# Patient Record
Sex: Male | Born: 1935 | Race: White | Hispanic: No | Marital: Married | Smoking: Never smoker
Health system: Southern US, Community
[De-identification: ages and names within clinical notes are randomized; demographics above are authoritative.]

## PROBLEM LIST (undated history)

## (undated) DIAGNOSIS — C801 Malignant (primary) neoplasm, unspecified: Secondary | ICD-10-CM

## (undated) DIAGNOSIS — I951 Orthostatic hypotension: Secondary | ICD-10-CM

## (undated) DIAGNOSIS — G20A1 Parkinson's disease without dyskinesia, without mention of fluctuations: Secondary | ICD-10-CM

## (undated) DIAGNOSIS — K469 Unspecified abdominal hernia without obstruction or gangrene: Secondary | ICD-10-CM

## (undated) DIAGNOSIS — G20 Parkinson's disease: Secondary | ICD-10-CM

## (undated) DIAGNOSIS — C4360 Malignant melanoma of unspecified upper limb, including shoulder: Secondary | ICD-10-CM

## (undated) DIAGNOSIS — I5032 Chronic diastolic (congestive) heart failure: Secondary | ICD-10-CM

## (undated) DIAGNOSIS — M1990 Unspecified osteoarthritis, unspecified site: Secondary | ICD-10-CM

## (undated) DIAGNOSIS — I480 Paroxysmal atrial fibrillation: Secondary | ICD-10-CM

## (undated) DIAGNOSIS — H409 Unspecified glaucoma: Secondary | ICD-10-CM

## (undated) DIAGNOSIS — R55 Syncope and collapse: Secondary | ICD-10-CM

## (undated) HISTORY — PX: JOINT REPLACEMENT: SHX530

## (undated) HISTORY — DX: Parkinson's disease: G20

## (undated) HISTORY — DX: Unspecified osteoarthritis, unspecified site: M19.90

## (undated) HISTORY — DX: Parkinson's disease without dyskinesia, without mention of fluctuations: G20.A1

## (undated) HISTORY — PX: EYE SURGERY: SHX253

## (undated) HISTORY — DX: Malignant (primary) neoplasm, unspecified: C80.1

## (undated) HISTORY — DX: Malignant melanoma of unspecified upper limb, including shoulder: C43.60

## (undated) HISTORY — DX: Orthostatic hypotension: I95.1

## (undated) HISTORY — DX: Unspecified glaucoma: H40.9

## (undated) HISTORY — DX: Unspecified abdominal hernia without obstruction or gangrene: K46.9

## (undated) HISTORY — DX: Syncope and collapse: R55

---

## 2003-05-28 DIAGNOSIS — H409 Unspecified glaucoma: Secondary | ICD-10-CM

## 2003-05-28 HISTORY — DX: Unspecified glaucoma: H40.9

## 2003-05-28 HISTORY — PX: REPLACEMENT TOTAL KNEE: SUR1224

## 2003-06-27 ENCOUNTER — Other Ambulatory Visit: Payer: Self-pay

## 2006-05-16 ENCOUNTER — Ambulatory Visit: Payer: Self-pay | Admitting: Family Medicine

## 2007-05-28 HISTORY — PX: LYMPH NODE DISSECTION: SHX5087

## 2007-05-28 HISTORY — PX: MELANOMA EXCISION: SHX5266

## 2007-11-25 ENCOUNTER — Ambulatory Visit: Payer: Self-pay | Admitting: Internal Medicine

## 2007-12-03 ENCOUNTER — Ambulatory Visit: Payer: Self-pay | Admitting: Internal Medicine

## 2007-12-10 ENCOUNTER — Ambulatory Visit: Payer: Self-pay | Admitting: Internal Medicine

## 2007-12-16 ENCOUNTER — Other Ambulatory Visit: Payer: Self-pay

## 2007-12-16 ENCOUNTER — Ambulatory Visit: Payer: Self-pay | Admitting: General Surgery

## 2007-12-26 ENCOUNTER — Ambulatory Visit: Payer: Self-pay | Admitting: Internal Medicine

## 2007-12-29 ENCOUNTER — Ambulatory Visit: Payer: Self-pay | Admitting: General Surgery

## 2007-12-29 DIAGNOSIS — C4360 Malignant melanoma of unspecified upper limb, including shoulder: Secondary | ICD-10-CM

## 2007-12-29 HISTORY — DX: Malignant melanoma of unspecified upper limb, including shoulder: C43.60

## 2008-01-26 ENCOUNTER — Ambulatory Visit: Payer: Self-pay | Admitting: Urology

## 2008-01-26 ENCOUNTER — Ambulatory Visit: Payer: Self-pay | Admitting: Internal Medicine

## 2008-06-27 ENCOUNTER — Ambulatory Visit: Payer: Self-pay | Admitting: Internal Medicine

## 2008-07-05 ENCOUNTER — Ambulatory Visit: Payer: Self-pay | Admitting: Internal Medicine

## 2008-07-25 ENCOUNTER — Ambulatory Visit: Payer: Self-pay | Admitting: Internal Medicine

## 2008-11-24 ENCOUNTER — Ambulatory Visit: Payer: Self-pay | Admitting: Internal Medicine

## 2008-12-06 ENCOUNTER — Ambulatory Visit: Payer: Self-pay | Admitting: Internal Medicine

## 2008-12-25 ENCOUNTER — Ambulatory Visit: Payer: Self-pay | Admitting: Internal Medicine

## 2009-01-25 ENCOUNTER — Ambulatory Visit: Payer: Self-pay | Admitting: Internal Medicine

## 2009-06-27 ENCOUNTER — Ambulatory Visit: Payer: Self-pay | Admitting: Internal Medicine

## 2009-07-14 ENCOUNTER — Ambulatory Visit: Payer: Self-pay | Admitting: Internal Medicine

## 2009-07-25 ENCOUNTER — Ambulatory Visit: Payer: Self-pay | Admitting: Internal Medicine

## 2010-01-01 ENCOUNTER — Ambulatory Visit: Payer: Self-pay | Admitting: Internal Medicine

## 2010-01-03 ENCOUNTER — Ambulatory Visit: Payer: Self-pay | Admitting: Internal Medicine

## 2010-01-05 ENCOUNTER — Ambulatory Visit: Payer: Self-pay | Admitting: Internal Medicine

## 2010-01-25 ENCOUNTER — Ambulatory Visit: Payer: Self-pay | Admitting: Internal Medicine

## 2010-07-25 ENCOUNTER — Ambulatory Visit: Payer: Self-pay | Admitting: Internal Medicine

## 2010-07-26 ENCOUNTER — Ambulatory Visit: Payer: Self-pay | Admitting: Internal Medicine

## 2010-08-26 ENCOUNTER — Ambulatory Visit: Payer: Self-pay | Admitting: Internal Medicine

## 2011-01-15 ENCOUNTER — Ambulatory Visit: Payer: Self-pay | Admitting: Internal Medicine

## 2011-02-12 ENCOUNTER — Ambulatory Visit: Payer: Self-pay | Admitting: Internal Medicine

## 2011-02-25 ENCOUNTER — Ambulatory Visit: Payer: Self-pay | Admitting: Internal Medicine

## 2011-08-14 ENCOUNTER — Ambulatory Visit: Payer: Self-pay | Admitting: Internal Medicine

## 2011-08-14 LAB — CBC CANCER CENTER
Basophil #: 0 x10 3/mm (ref 0.0–0.1)
Basophil %: 0.7 %
Eosinophil #: 0.2 x10 3/mm (ref 0.0–0.7)
Eosinophil %: 3.6 %
HCT: 41.8 % (ref 40.0–52.0)
Lymphocyte #: 1.4 x10 3/mm (ref 1.0–3.6)
MCH: 29.8 pg (ref 26.0–34.0)
MCHC: 33.3 g/dL (ref 32.0–36.0)
MCV: 89 fL (ref 80–100)
Monocyte #: 0.6 x10 3/mm (ref 0.0–0.7)
Monocyte %: 10.7 %
Neutrophil #: 3.5 x10 3/mm (ref 1.4–6.5)
Platelet: 185 x10 3/mm (ref 150–440)
RBC: 4.68 10*6/uL (ref 4.40–5.90)
RDW: 14.6 % — ABNORMAL HIGH (ref 11.5–14.5)
WBC: 5.7 x10 3/mm (ref 3.8–10.6)

## 2011-08-14 LAB — COMPREHENSIVE METABOLIC PANEL
Albumin: 3.7 g/dL (ref 3.4–5.0)
Alkaline Phosphatase: 87 U/L (ref 50–136)
Anion Gap: 5 — ABNORMAL LOW (ref 7–16)
BUN: 17 mg/dL (ref 7–18)
Bilirubin,Total: 0.7 mg/dL (ref 0.2–1.0)
Calcium, Total: 9 mg/dL (ref 8.5–10.1)
Creatinine: 1.13 mg/dL (ref 0.60–1.30)
Glucose: 90 mg/dL (ref 65–99)
Osmolality: 282 (ref 275–301)
Potassium: 3.3 mmol/L — ABNORMAL LOW (ref 3.5–5.1)
Sodium: 141 mmol/L (ref 136–145)
Total Protein: 7.7 g/dL (ref 6.4–8.2)

## 2011-08-26 ENCOUNTER — Ambulatory Visit: Payer: Self-pay | Admitting: Internal Medicine

## 2011-10-02 ENCOUNTER — Ambulatory Visit: Payer: Self-pay | Admitting: Family Medicine

## 2012-04-28 ENCOUNTER — Ambulatory Visit: Payer: Self-pay | Admitting: Family Medicine

## 2012-06-12 ENCOUNTER — Ambulatory Visit: Payer: Self-pay | Admitting: Family Medicine

## 2012-09-20 ENCOUNTER — Emergency Department: Payer: Self-pay | Admitting: Emergency Medicine

## 2012-10-06 ENCOUNTER — Ambulatory Visit: Payer: Self-pay | Admitting: Family Medicine

## 2012-11-06 ENCOUNTER — Ambulatory Visit: Payer: Self-pay | Admitting: Family Medicine

## 2012-12-17 ENCOUNTER — Encounter: Payer: Self-pay | Admitting: *Deleted

## 2013-01-04 ENCOUNTER — Ambulatory Visit (INDEPENDENT_AMBULATORY_CARE_PROVIDER_SITE_OTHER): Payer: Medicare Other | Admitting: General Surgery

## 2013-01-04 ENCOUNTER — Encounter: Payer: Self-pay | Admitting: General Surgery

## 2013-01-04 VITALS — BP 126/80 | HR 76 | Resp 13 | Ht 71.0 in | Wt 132.0 lb

## 2013-01-04 DIAGNOSIS — Z85820 Personal history of malignant melanoma of skin: Secondary | ICD-10-CM

## 2013-01-04 DIAGNOSIS — K4090 Unilateral inguinal hernia, without obstruction or gangrene, not specified as recurrent: Secondary | ICD-10-CM

## 2013-01-04 DIAGNOSIS — K4030 Unilateral inguinal hernia, with obstruction, without gangrene, not specified as recurrent: Secondary | ICD-10-CM | POA: Insufficient documentation

## 2013-01-04 NOTE — Progress Notes (Signed)
Patient ID: Randy Mcknight, male   DOB: [birth year], 79 y.o.   MRN: [id number]  Chief Complaint  Patient presents with  . Other    hernia    HPI Randy Mcknight is a 79 y.o. male here today for an inguinal hernia. Patient states in 2009 showed up on an ct scan. Patient states they are getting bigger.No pain but is rubbing the inside on his legs is producing local discomfort.  The patient's accompanied by his wife of 40 years.  The patient was last seen in 2009 at one and underwent wide excision and sentinel node biopsy of a right upper extremity melanoma. At that exam, the hernia was reducible and asymptomatic.  The patient reports some constipation and occasional hard stools. He has not appreciated any "gurgling" at the hernia site.  The patient has made use of Coumadin since 2005 with atrial fibrillation was identified. He has stopped at when needed for operative intervention. His wife reports that with his Parkinson's he is becoming more unsteady on his feet and has had several falls. There has been some discussion of the relative risk of a intracranial bleed with falling as opposed to the risk of CVA with his atrial fibrillation.  HPI  Past Medical History  Diagnosis Date  . Arthritis   . Hernia   . Cancer     Basal cell melanoma  . Atrial fibrillation   . Parkinson disease   . Glaucoma 2005    Past Surgical History  Procedure Laterality Date  . Lymph node dissection  2009  . Eye surgery Bilateral   . Melanoma excision  2009  . Replacement total knee Right 2005    Family History  Problem Relation Age of Onset  . Stroke Mother     Social History History  Substance Use Topics  . Smoking status: Never Smoker   . Smokeless tobacco: Never Used  . Alcohol Use: No    Allergies  Allergen Reactions  . Latex     Current Outpatient Prescriptions  Medication Sig Dispense Refill  . brimonidine (ALPHAGAN P) 0.1 % SOLN Place 1 drop into both eyes 2 (two) times  daily.      . carbidopa-levodopa (SINEMET IR) 25-100 MG per tablet Take 2 tablets by mouth 3 (three) times daily.       Marland Kitchen COUMADIN 4 MG tablet Take 1 tablet by mouth every 3 (three) days.       . dorzolamide (TRUSOPT) 2 % ophthalmic solution 1 drop 2 (two) times daily.      Marland Kitchen erythromycin ophthalmic ointment Place 1 application into both eyes at bedtime.      Marland Kitchen latanoprost (XALATAN) 0.005 % ophthalmic solution Place 1 drop into both eyes at bedtime.      Marland Kitchen warfarin (COUMADIN) 3 MG tablet Take 3 mg by mouth every other day.       No current facility-administered medications for this visit.    Review of Systems Review of Systems  Constitutional: Negative.   Respiratory: Negative.   Cardiovascular: Negative.   Gastrointestinal: Positive for constipation. Negative for nausea, vomiting, abdominal pain, diarrhea, blood in stool, abdominal distention, anal bleeding and rectal pain.    Blood pressure 126/80, pulse 76, resp. rate 13, height 5\' 11"  (1.803 m), weight 132 lb (59.875 kg). The patient's weight is down 15 pounds from his 2009 exam.  Physical Exam Physical Exam  Constitutional: He is oriented to person, place, and time. He appears well-developed and well-nourished.  Cardiovascular: Normal  rate and normal heart sounds.  An irregular rhythm present.  Left leg edema present since an injury several decades ago. No history of DVT.  The patient has long structures a regular rhythm followed by intermittent premature beats.  Pulmonary/Chest: Breath sounds normal.  Abdominal: Soft. Bowel sounds are normal.  12 cm non reducible left inguinal  Hernia . Bilateral dermatitis involving the base of the scrotum and the upper inner thighs bilaterally.  I cannot appreciate a right inguinal hernia with examination of the supine or standing position.  Neurological: He is alert and oriented to person, place, and time.  Skin: Skin is warm and dry.    Data Reviewed 2009, 2012 CT scans. On the 2012  scan the colon was involved in the left inguinal hernia without evidence of obstruction.  Assessment    Incarcerated left inguinal hernia with local dermatitis, no obstructive symptoms.     Plan      The patient is at high risk for surgical intervention based on his cardiac history, Parkinson's disease, weight loss and mobility constraints. There is a large defect which will likely require open reduction and placement of a large mesh utilizing the Stoppa technique.  Risks are not limited to anesthesia, but also include but are not limited to  UTI, urinary disturbance,CVA with cessation of anticoagulation and chronic pain.  The patient and his wife will consider whether they are willing to accept the risks associated with surgery, and all contact the patient's primary care physician for his assessment of his suitability for general anesthesia.         Randy Mcknight 01/04/2013, 9:01 PM

## 2013-01-04 NOTE — Patient Instructions (Signed)

## 2013-01-06 ENCOUNTER — Telehealth: Payer: Self-pay | Admitting: *Deleted

## 2013-01-06 NOTE — Telephone Encounter (Signed)
Message copied by Currie Paris on Wed Jan 06, 2013 10:05 AM ------      Message from: [name]      Created: Tue Jan 05, 2013  5:49 PM       Please notify the patient I spoke with Dr. Sherrie Mustache.  He does not see any absolute contraindication to surgery, but would want him to be evaluated by Dr. Lady Gary (who he saw in 2009 prior to melanoma surgery) for final approval.  If he is thinking about repair of the hernia, we can arrange an appt w/ Dr. Lady Gary.             Thanks ------

## 2013-01-06 NOTE — Telephone Encounter (Signed)
Notified patient wife as instructed. They want to think it over (having hernia surgery) and call back. Discussed follow-up appointments with Dr Lady Gary for preop if surgery is desired, patient agrees

## 2014-01-11 ENCOUNTER — Encounter: Payer: Self-pay | Admitting: General Surgery

## 2014-01-11 ENCOUNTER — Other Ambulatory Visit: Payer: Medicare Other

## 2014-01-11 ENCOUNTER — Ambulatory Visit (INDEPENDENT_AMBULATORY_CARE_PROVIDER_SITE_OTHER): Payer: Medicare Other | Admitting: General Surgery

## 2014-01-11 VITALS — BP 110/60 | HR 66 | Resp 12 | Ht 71.0 in | Wt 131.0 lb

## 2014-01-11 DIAGNOSIS — R2230 Localized swelling, mass and lump, unspecified upper limb: Secondary | ICD-10-CM | POA: Insufficient documentation

## 2014-01-11 DIAGNOSIS — R2231 Localized swelling, mass and lump, right upper limb: Secondary | ICD-10-CM

## 2014-01-11 DIAGNOSIS — R229 Localized swelling, mass and lump, unspecified: Secondary | ICD-10-CM

## 2014-01-11 NOTE — Progress Notes (Signed)
Patient ID: Randy Mcknight, male   DOB: 1930-08-18, 80 y.o.   MRN: [id number]  No chief complaint on file.   HPI Randy Mcknight is a 80 y.o. male.  Here for evaluation of a right arm mass. Wife does not seem to think it has changed in size. He denies any pain. States it is close to the same area that was removed in 2009. The patient is accompanied today by his wife for 8 years who was present for the interview and exam. She reported that he has had several early melanomas removed by Dr. Evorn Gong since is 2009 wide excision.  The area of present concern has been present for a few weeks. Tenderness with direct pressure, otherwise asymptomatic.  Chronic Coumadin therapy for atrial fibrillation that developed after his 2005 total knee replacement.  Since his last visit he has developed some cognitive dysfunction.  HPI  Past Medical History  Diagnosis Date  . Arthritis   . Hernia   . Atrial fibrillation   . Parkinson disease   . Glaucoma 2005  . Cancer     Basal cell melanoma  . Malignant melanoma of skin of upper limb, including shoulder December 29, 2007    wide excision Clark level IV,, 3 mm deep malignant melanoma,, negative margins, negative sentinel nodes.pT3a,N0    Past Surgical History  Procedure Laterality Date  . Lymph node dissection  2009  . Eye surgery Bilateral   . Melanoma excision  2009  . Replacement total knee Right 2005    Family History  Problem Relation Age of Onset  . Stroke Mother     Social History History  Substance Use Topics  . Smoking status: Never Smoker   . Smokeless tobacco: Never Used  . Alcohol Use: No    Allergies  Allergen Reactions  . Latex   . Ivp Dye [Iodinated Diagnostic Agents] Rash    Current Outpatient Prescriptions  Medication Sig Dispense Refill  . brimonidine (ALPHAGAN P) 0.1 % SOLN Place 1 drop into both eyes 2 (two) times daily.      . carbidopa-levodopa (SINEMET IR) 25-100 MG per tablet Take 2 tablets by mouth  3 (three) times daily.       Marland Kitchen COUMADIN 4 MG tablet Take 1 tablet by mouth every 3 (three) days.       Marland Kitchen donepezil (ARICEPT) 10 MG tablet Take 10 mg by mouth at bedtime.      . dorzolamide (TRUSOPT) 2 % ophthalmic solution 1 drop 2 (two) times daily.      Marland Kitchen latanoprost (XALATAN) 0.005 % ophthalmic solution Place 1 drop into both eyes at bedtime.      Marland Kitchen warfarin (COUMADIN) 3 MG tablet Take 3 mg by mouth every other day.       No current facility-administered medications for this visit.    Review of Systems Review of Systems  Constitutional: Negative.   Respiratory: Negative.   Cardiovascular: Negative.     Blood pressure 110/60, pulse 66, resp. rate 12, height 5\' 11"  (1.803 m), weight 131 lb (59.421 kg).  Physical Exam Physical Exam  Constitutional: He is oriented to person, place, and time. He appears well-developed and well-nourished.  Neck: Neck supple.  Cardiovascular: Normal rate.  An irregular rhythm present.  A Fib  Pulmonary/Chest: Effort normal and breath sounds normal.  Musculoskeletal:       Arms: Lymphadenopathy:    He has no cervical adenopathy.    He has no axillary adenopathy.  Neurological: He  is alert and oriented to person, place, and time.  Skin: Skin is warm and dry.    Data Reviewed PCP notes of January 06, 2014.  2009 operative report and pathology.  Ultrasound examination of the mass was completed. This shows a 1.7 x 2.4 x 4.0 irregular heterogeneous mass with multiple areas of vascular flow on duplex imaging. Highly suspicious for malignancy.  The patient was amenable to FNA sampling. This was completed using 1 cc of 1% plain Xylocaine. A 22-gauge needle was passed and the lesion and multiple areas traversed with return of about 4 cc of bloody fluid and slight diminution in size. This was diluted with an equal volume of cytology fixative for cytologic review. Pressure was held over the area and no bleeding was noted.  Assessment    Right upper arm  mass suspicious for malignancy.     Plan    The patient/wife will be contacted with cytology is available. They are aware that this may be early next week.      PCP: [name] 01/11/2014, 8:53 PM

## 2014-01-11 NOTE — Patient Instructions (Signed)
The patient is aware to call back for any questions or concerns.  

## 2014-01-13 LAB — CYTOLOGY - NON PAP

## 2014-01-14 ENCOUNTER — Telehealth: Payer: Self-pay | Admitting: General Surgery

## 2014-01-14 NOTE — Telephone Encounter (Signed)
Notified cyto results suspicious but not confirmatory for recurrent melanoma.   They will be contacted after case review w/ medical oncology.

## 2014-01-17 ENCOUNTER — Telehealth: Payer: Self-pay | Admitting: *Deleted

## 2014-01-17 ENCOUNTER — Other Ambulatory Visit: Payer: Self-pay | Admitting: General Surgery

## 2014-01-17 DIAGNOSIS — R2231 Localized swelling, mass and lump, right upper limb: Secondary | ICD-10-CM

## 2014-01-17 NOTE — Telephone Encounter (Signed)
Message copied by Dominga Ferry on Mon Jan 17, 2014 12:42 PM ------      Message from: Robert Bellow      Created: Fri Jan 14, 2014  4:28 PM       Patient posted with OR for excision right arm mass on Friday, August 28th.  Wife knows to stop coumadin after Saturday, Aug 22 dose.        Will need preop appointment w/ SDS.      Wife knows to expect your call. Thanks.       ------

## 2014-01-17 NOTE — Telephone Encounter (Signed)
Patient's surgery has been scheduled for 01-21-14 at [facility]. This patient wife was reminded about discontinuing the coumadin and states they have done so accordingly. Pre-admit appointment arranged for this Wednesday, 01-19-14 at 8 am.

## 2014-01-19 ENCOUNTER — Ambulatory Visit: Payer: Self-pay | Admitting: General Surgery

## 2014-01-19 LAB — CBC WITH DIFFERENTIAL/PLATELET
BASOS ABS: 0.1 10*3/uL (ref 0.0–0.1)
BASOS PCT: 1 %
Eosinophil #: 0.3 10*3/uL (ref 0.0–0.7)
Eosinophil %: 5.1 %
HCT: 44.3 % (ref 40.0–52.0)
HGB: 14 g/dL (ref 13.0–18.0)
LYMPHS ABS: 1.7 10*3/uL (ref 1.0–3.6)
LYMPHS PCT: 28.6 %
MCH: 29.3 pg (ref 26.0–34.0)
MCHC: 31.6 g/dL — ABNORMAL LOW (ref 32.0–36.0)
MCV: 93 fL (ref 80–100)
MONO ABS: 0.6 x10 3/mm (ref 0.2–1.0)
MONOS PCT: 9.4 %
NEUTROS PCT: 55.9 %
Neutrophil #: 3.4 10*3/uL (ref 1.4–6.5)
PLATELETS: 175 10*3/uL (ref 150–440)
RBC: 4.79 10*6/uL (ref 4.40–5.90)
RDW: 14.4 % (ref 11.5–14.5)
WBC: 6 10*3/uL (ref 3.8–10.6)

## 2014-01-19 LAB — BASIC METABOLIC PANEL
Anion Gap: 6 — ABNORMAL LOW (ref 7–16)
BUN: 15 mg/dL (ref 7–18)
Calcium, Total: 8.4 mg/dL — ABNORMAL LOW (ref 8.5–10.1)
Chloride: 105 mmol/L (ref 98–107)
Co2: 31 mmol/L (ref 21–32)
Creatinine: 0.86 mg/dL (ref 0.60–1.30)
EGFR (African American): >60
EGFR (Non-African Amer.): >60
Glucose: 103 mg/dL — ABNORMAL HIGH (ref 65–99)
OSMOLALITY: 284 (ref 275–301)
Potassium: 4 mmol/L (ref 3.5–5.1)
Sodium: 142 mmol/L (ref 136–145)

## 2014-01-20 ENCOUNTER — Encounter: Payer: Self-pay | Admitting: General Surgery

## 2014-01-21 ENCOUNTER — Ambulatory Visit: Payer: Self-pay | Admitting: General Surgery

## 2014-01-21 DIAGNOSIS — C4360 Malignant melanoma of unspecified upper limb, including shoulder: Secondary | ICD-10-CM

## 2014-01-21 HISTORY — PX: MASS EXCISION: SHX2000

## 2014-01-23 ENCOUNTER — Encounter: Payer: Self-pay | Admitting: General Surgery

## 2014-01-23 NOTE — Progress Notes (Signed)
Patient ID: Randy Mcknight, male   DOB: 02-Sep-1930, 80 y.o.   MRN: [id number]  No chief complaint on file.   HPI Randy Mcknight is a 80 y.o. male.  The patient underwent biopsy of a right distal posterior upper arm mass suspicious for recurrent melanoma on January 21, 2014. His wife called today reporting that since surgery he has been unable to move his hand. He has had little pain, requiring no narcotics  Since the morning of August 29. What prompted her call this evening was that she does some firmer swelling below the Ace wrap applied at the time of surgery. She had been instructed to loosen this should she appreciate any swelling in his hand or fingers.  The weakness in the hand has not progressed since what the patient's wife noted when she brought him home for surgery. This has made transverse difficult, as he could not grasp her when she is assisting him. HPI  Past Medical History  Diagnosis Date  . Arthritis   . Hernia   . Atrial fibrillation   . Parkinson disease   . Glaucoma 2005  . Cancer     Basal cell melanoma  . Malignant melanoma of skin of upper limb, including shoulder December 29, 2007    wide excision Clark level IV,, 3 mm deep malignant melanoma,, negative margins, negative sentinel nodes.pT3a,N0    Past Surgical History  Procedure Laterality Date  . Lymph node dissection  2009  . Eye surgery Bilateral   . Melanoma excision  2009  . Replacement total knee Right 2005    Family History  Problem Relation Age of Onset  . Stroke Mother     Social History History  Substance Use Topics  . Smoking status: Never Smoker   . Smokeless tobacco: Never Used  . Alcohol Use: No    Allergies  Allergen Reactions  . Latex   . Ivp Dye [Iodinated Diagnostic Agents] Rash    Current Outpatient Prescriptions  Medication Sig Dispense Refill  . brimonidine (ALPHAGAN P) 0.1 % SOLN Place 1 drop into both eyes 2 (two) times daily.      . carbidopa-levodopa (SINEMET  IR) 25-100 MG per tablet Take 2 tablets by mouth 3 (three) times daily.       Marland Kitchen COUMADIN 4 MG tablet Take 1 tablet by mouth every 3 (three) days.       Marland Kitchen donepezil (ARICEPT) 10 MG tablet Take 10 mg by mouth at bedtime.      . dorzolamide (TRUSOPT) 2 % ophthalmic solution 1 drop 2 (two) times daily.      Marland Kitchen latanoprost (XALATAN) 0.005 % ophthalmic solution Place 1 drop into both eyes at bedtime.      Marland Kitchen warfarin (COUMADIN) 3 MG tablet Take 3 mg by mouth every other day.       No current facility-administered medications for this visit.    Review of Systems Review of Systems  There were no vitals taken for this visit.  Physical Exam Physical Exam The patient was seen at his home accompanied by his wife as well as 2/the results.  The patient is in no distress. The thickening that generated concern is in the medial proximal forearm and to a lesser extent the lateral proximal forearm. There is no edema of the distal forearm or hand. No venous engorgement is noted. Radial pulses strong.  The patient obviously lacks dorsiflexion of the hand. Light touch sensation is preserved on all digits. Grip is reasonable. He  is not able to extend his fingers.  The dressing was removed. No discernible hematoma was evident at the biopsy site. Pinpoint bleeding at the staples is appreciated. (Coumadin has been held since 5 days prior to surgery). No tenderness, erythema or induration is appreciated.    Assessment    Weakness of wrist dorsiflexors, questionable secondary to nerve trauma during biopsy versus compression for hemostasis.     Plan    Due to the late hour, the patient's wife has been asked to make use of a rolled hand towel to keep the wrist extended to minimize contracture. Though continue elevation as previously requested. The Ace wrap has been discontinued and a new dressing with gauze and a Kerlix wrap was applied.  We'll plan for followup as originally scheduled on September 1 at 10:30  AM. His wife will call if further changes develop.          Robert Bellow 01/23/2014, 9:14 PM

## 2014-01-24 ENCOUNTER — Telehealth: Payer: Self-pay | Admitting: General Surgery

## 2014-01-24 LAB — PATHOLOGY REPORT

## 2014-01-24 NOTE — Telephone Encounter (Signed)
Notified path confirmed clinical impression of recurrent malignant melanoma.  Will f./u tomorrow as planned.

## 2014-01-25 ENCOUNTER — Ambulatory Visit: Payer: Self-pay | Admitting: Internal Medicine

## 2014-01-25 ENCOUNTER — Telehealth: Payer: Self-pay | Admitting: *Deleted

## 2014-01-25 ENCOUNTER — Ambulatory Visit: Payer: Medicare Other | Admitting: General Surgery

## 2014-01-25 ENCOUNTER — Ambulatory Visit (INDEPENDENT_AMBULATORY_CARE_PROVIDER_SITE_OTHER): Payer: Self-pay | Admitting: General Surgery

## 2014-01-25 ENCOUNTER — Encounter: Payer: Self-pay | Admitting: General Surgery

## 2014-01-25 VITALS — BP 108/78 | HR 56 | Resp 12 | Ht 71.0 in | Wt 133.0 lb

## 2014-01-25 DIAGNOSIS — C799 Secondary malignant neoplasm of unspecified site: Secondary | ICD-10-CM

## 2014-01-25 DIAGNOSIS — R229 Localized swelling, mass and lump, unspecified: Secondary | ICD-10-CM

## 2014-01-25 DIAGNOSIS — R2231 Localized swelling, mass and lump, right upper limb: Secondary | ICD-10-CM

## 2014-01-25 DIAGNOSIS — G5631 Lesion of radial nerve, right upper limb: Secondary | ICD-10-CM

## 2014-01-25 DIAGNOSIS — C439 Malignant melanoma of skin, unspecified: Secondary | ICD-10-CM

## 2014-01-25 DIAGNOSIS — G5630 Lesion of radial nerve, unspecified upper limb: Secondary | ICD-10-CM

## 2014-01-25 NOTE — Telephone Encounter (Signed)
Pts wife wants you to call her when you get a chance regarding [facility]

## 2014-01-25 NOTE — Progress Notes (Signed)
Patient ID: Randy Mcknight, male   DOB: 04-11-1931, 80 y.o.   MRN: [id number]  Chief Complaint  Patient presents with  . Routine Post Op    right upper arm mass excision    HPI Randy Mcknight is a 80 y.o. male.  Here today for postoperative excision right arm mass. States he is doing well. He has had trouble with his grip in the right hand since surgery. The patient was seen at his home on August 30 when I first became aware of his difficulty with extension of the right hand. Sensation in the radial nerve distribution was preserved, but he obviously lacked dorsiflexion of the wrist. The patient is accompanied by his wife as well as his son who lives in [place]. His son is a Teaching laboratory technician.  He is reporting minimal pain, and no need for narcotics the last 24-48 hours.  He is here today in a wheel chair with his family.  HPI  Past Medical History  Diagnosis Date  . Arthritis   . Hernia   . Atrial fibrillation   . Parkinson disease   . Glaucoma 2005  . Cancer     Basal cell melanoma  . Malignant melanoma of skin of upper limb, including shoulder December 29, 2007    wide excision Clark level IV,, 3 mm deep malignant melanoma,, negative margins, negative sentinel nodes.pT3a,N0    Past Surgical History  Procedure Laterality Date  . Lymph node dissection  2009  . Eye surgery Bilateral   . Melanoma excision  2009  . Replacement total knee Right 2005  . Mass excision Right 01-21-14    right upper arm    Family History  Problem Relation Age of Onset  . Stroke Mother     Social History History  Substance Use Topics  . Smoking status: Never Smoker   . Smokeless tobacco: Never Used  . Alcohol Use: No    Allergies  Allergen Reactions  . Latex   . Ivp Dye [Iodinated Diagnostic Agents] Rash    Current Outpatient Prescriptions  Medication Sig Dispense Refill  . aspirin 81 MG tablet Take 81 mg by mouth daily.      . brimonidine (ALPHAGAN P) 0.1 % SOLN Place 1  drop into both eyes 2 (two) times daily.      . carbidopa-levodopa (SINEMET IR) 25-100 MG per tablet Take 2 tablets by mouth 3 (three) times daily.       Marland Kitchen donepezil (ARICEPT) 10 MG tablet Take 10 mg by mouth at bedtime.      . dorzolamide (TRUSOPT) 2 % ophthalmic solution 1 drop 2 (two) times daily.      Marland Kitchen latanoprost (XALATAN) 0.005 % ophthalmic solution Place 1 drop into both eyes at bedtime.       No current facility-administered medications for this visit.    Review of Systems Review of Systems  Constitutional: Negative.   Respiratory: Negative.   Cardiovascular: Negative.     Blood pressure 108/78, pulse 56, resp. rate 12, height 5\' 11"  (1.803 m), weight 133 lb (60.328 kg).  Physical Exam Physical Exam  Constitutional: He is oriented to person, place, and time. He appears well-developed and well-nourished.  Musculoskeletal:       Arms: Neurological: He is alert and oriented to person, place, and time.  He can feel Q tip touching the hand  Skin: Skin is warm and dry.  Residual swelling right arm elbow area. Staples intact, incision clean.  Examination shows preservation of  sensation in the distribution of the radial nerve but essentially no motor function in the dorsal flexure muscles.  Data Reviewed Pathology showed metastatic melanoma. Local reactive bone changes noted.  Assessment    Metastatic melanoma, 6-years status post wide excision with negative sentinel node biopsy. New radial nerve palsy.    Plan    The patient had his procedure with the right arm supported on over the table armboard, padded with foam gauze. A pressure dressing was applied to the area with a light Ace wrap due to the marked vascularity of the tumor. Suture ligation was used for hemostasis, and it is possible that a partial injury of the radial nerve occurred usual from the biopsy itself, attempts to establish hemostasis or the pressure dressing applied post procedure.  Consultation had been  in formerly obtained with orthopedics. In light of the findings on today's exam, Earnestine Leys, M.D. From [place] has agreed to see the patient this morning for assessment and recommendations regarding the identified radial nerve palsy. (The patient had cared for by Dr. Marry Guan in the past, but he is on an anniversary trip to [place] as was unavailable.  The patient's wife has seen Dr. Tamala Julian at [facility], and was amenable to Dr. Ammie Ferrier assessment.     Patient is scheduled for a PET scan at [facility] on 01/27/14 at 9:30 am. He is to arrive by 9:15 am. He is to have nothing to eat at least 6 hours prior. He is to drink a large amount of water prior to the exam. He may take his medications as ordered. He is to have a high protein/low carbohydrate meal the night before his exam. Patient is aware of date, time, and instructions.  Patient is scheduled to see Dr Cynda Acres at [facility] on 01/28/14 at 2:00 pm.  He is to see orthopedic for right hand splint. [facility] for activity of daily living evaluation.  PCP/Ref: [name] 01/26/2014, 9:42 PM

## 2014-01-25 NOTE — Patient Instructions (Addendum)
The patient is aware to call back for any questions or concerns.  Patient is scheduled for a PET scan at [facility] on 01/27/14 at 9:30 am. He is to arrive by 9:15 am. He is to have nothing to eat at least 6 hours prior. He is to drink a large amount of water prior to the exam. He may take his medications as ordered. He is to have a high protein/low carbohydrate meal the night before his exam. Patient is aware of date, time, and instructions.  Patient is scheduled to see Dr Cynda Acres at [facility] on 01/28/14 at 2:00 pm. Oncologist appointment with Dr. Cynda Acres is for 01-28-14

## 2014-01-25 NOTE — Telephone Encounter (Signed)
I talked with the wife, she states Dr Sabra Heck placed a splint on his hand and that he would order home health O.T. and an aide for him.

## 2014-01-26 DIAGNOSIS — C799 Secondary malignant neoplasm of unspecified site: Secondary | ICD-10-CM | POA: Insufficient documentation

## 2014-01-26 DIAGNOSIS — C439 Malignant melanoma of skin, unspecified: Secondary | ICD-10-CM | POA: Insufficient documentation

## 2014-01-26 DIAGNOSIS — G5630 Lesion of radial nerve, unspecified upper limb: Secondary | ICD-10-CM | POA: Insufficient documentation

## 2014-01-27 ENCOUNTER — Telehealth: Payer: Self-pay | Admitting: General Surgery

## 2014-01-27 ENCOUNTER — Encounter: Payer: Self-pay | Admitting: General Surgery

## 2014-01-27 ENCOUNTER — Ambulatory Visit: Payer: Self-pay | Admitting: General Surgery

## 2014-01-27 NOTE — Telephone Encounter (Signed)
Wife notified PET/ CT showed no other areas suggestive of metastatic disease. Notified he could restart coumadin.  They are hesitant due to his potential for falls.  She will f/u w/ neurology/ cardiology/ PCP in this regard. In the interim, he will continue his pediatric ASA.  F/U w. Dr. Cynda Acres tomorrow to investigate further options for treatment.

## 2014-01-28 ENCOUNTER — Encounter: Payer: Self-pay | Admitting: General Surgery

## 2014-01-28 ENCOUNTER — Ambulatory Visit: Payer: Self-pay | Admitting: Internal Medicine

## 2014-01-28 LAB — CREATININE, SERUM
Creatinine: 0.95 mg/dL (ref 0.60–1.30)
EGFR (Non-African Amer.): >60

## 2014-01-28 LAB — HEPATIC FUNCTION PANEL A (ARMC)
ALT: 7 U/L — AB
Albumin: 3.2 g/dL — ABNORMAL LOW (ref 3.4–5.0)
Alkaline Phosphatase: 79 U/L
Bilirubin, Direct: 0.1 mg/dL (ref 0.00–0.20)
Bilirubin,Total: 0.5 mg/dL (ref 0.2–1.0)
SGOT(AST): 13 U/L — ABNORMAL LOW (ref 15–37)
Total Protein: 6.9 g/dL (ref 6.4–8.2)

## 2014-02-01 ENCOUNTER — Ambulatory Visit (INDEPENDENT_AMBULATORY_CARE_PROVIDER_SITE_OTHER): Payer: Self-pay | Admitting: *Deleted

## 2014-02-01 DIAGNOSIS — R2231 Localized swelling, mass and lump, right upper limb: Secondary | ICD-10-CM

## 2014-02-01 DIAGNOSIS — R229 Localized swelling, mass and lump, unspecified: Secondary | ICD-10-CM

## 2014-02-01 LAB — PROTIME-INR
INR: 1.3
PROTHROMBIN TIME: 16 s — AB (ref 11.5–14.7)

## 2014-02-01 LAB — CANCER CTR PLATELET CT: Platelet: 193 x10 3/mm (ref 150–440)

## 2014-02-01 NOTE — Addendum Note (Signed)
Addended by: Carson Myrtle on: 02/01/2014 01:05 PM   Modules accepted: Medications

## 2014-02-01 NOTE — Progress Notes (Addendum)
Wife called to say that the right arm dressing had bloody drainage on it and it was "puffy". I had her bring him in prior to going to have labs drawn. He says it is a little tender to touch but not unbearable.The incision is clean no infection noted, staples removed steri strips applied. Firm swelling under incision is noted as well as active old dark bloody drainage. Wife aware this may persist. He has an appointment with Dr Bary Castilla for tomorrow prior to his scan, we will keep this appt. Aware to use ice pack off and on for today and that I will let Dr Bary Castilla know and if there is any additional treatment I will call, pt and wife agrees.  [facility] called and was going to go ahead and change his dressing while he was there. He has started back on his Lovenox and coumadin.

## 2014-02-02 ENCOUNTER — Ambulatory Visit (INDEPENDENT_AMBULATORY_CARE_PROVIDER_SITE_OTHER): Payer: Self-pay | Admitting: General Surgery

## 2014-02-02 ENCOUNTER — Encounter: Payer: Self-pay | Admitting: General Surgery

## 2014-02-02 VITALS — BP 140/70 | HR 66 | Resp 14 | Ht 71.0 in | Wt 133.0 lb

## 2014-02-02 DIAGNOSIS — C799 Secondary malignant neoplasm of unspecified site: Secondary | ICD-10-CM

## 2014-02-02 DIAGNOSIS — C439 Malignant melanoma of skin, unspecified: Secondary | ICD-10-CM

## 2014-02-02 LAB — PROTIME-INR
INR: 1.3
Prothrombin Time: 15.9 secs — ABNORMAL HIGH (ref 11.5–14.7)

## 2014-02-02 LAB — PLATELET COUNT: Platelet: 202 10*3/uL (ref 150–440)

## 2014-02-02 NOTE — Progress Notes (Signed)
Patient ID: Randy Mcknight, male   DOB: 07-11-1930, 80 y.o.   MRN: [id number]  Chief Complaint  Patient presents with  . Follow-up    post op right arm excision    HPI Randy Mcknight is a 80 y.o. male who presents for a post op follow up of a right arm excision. The procedure was performed on 01/21/14. He has had some bleeding at the excision site that his wife is concerned about. No other complaints at this time.  The patient was seen yesterday by the nurse and a small amount of hematoma contents evacuated. Minimal drainage since that time. Minimal pain. The patient's wife reports that she is working with him and has noted that he is now better able to grip objects once there placed in his grasp.  HPI  Past Medical History  Diagnosis Date  . Arthritis   . Hernia   . Atrial fibrillation   . Parkinson disease   . Glaucoma 2005  . Cancer     Basal cell melanoma  . Malignant melanoma of skin of upper limb, including shoulder December 29, 2007    wide excision Clark level IV,, 3 mm deep malignant melanoma,, negative margins, negative sentinel nodes.pT3a,N0    Past Surgical History  Procedure Laterality Date  . Lymph node dissection  2009  . Eye surgery Bilateral   . Melanoma excision  2009  . Replacement total knee Right 2005  . Mass excision Right 01-21-14    right upper arm    Family History  Problem Relation Age of Onset  . Stroke Mother     Social History History  Substance Use Topics  . Smoking status: Never Smoker   . Smokeless tobacco: Never Used  . Alcohol Use: No    Allergies  Allergen Reactions  . Latex   . Ivp Dye [Iodinated Diagnostic Agents] Rash    Current Outpatient Prescriptions  Medication Sig Dispense Refill  . aspirin 81 MG tablet Take 81 mg by mouth daily.      . brimonidine (ALPHAGAN P) 0.1 % SOLN Place 1 drop into both eyes 2 (two) times daily.      . carbidopa-levodopa (SINEMET IR) 25-100 MG per tablet Take 2 tablets by mouth 3 (three)  times daily.       Marland Kitchen COUMADIN 3 MG tablet       . donepezil (ARICEPT) 10 MG tablet Take 10 mg by mouth at bedtime.      . dorzolamide (TRUSOPT) 2 % ophthalmic solution 1 drop 2 (two) times daily.      Marland Kitchen latanoprost (XALATAN) 0.005 % ophthalmic solution Place 1 drop into both eyes at bedtime.      Marland Kitchen LOVENOX 30 MG/0.3ML injection        No current facility-administered medications for this visit.    Review of Systems Review of Systems  Constitutional: Negative.   Respiratory: Negative.   Cardiovascular: Negative.     Blood pressure 140/70, pulse 66, resp. rate 14, height 5\' 11"  (1.803 m), weight 133 lb (60.328 kg).  Physical Exam Physical Exam  Constitutional: He appears well-developed and well-nourished.  Musculoskeletal:       Arms:      Hands: Skin: Skin is warm and dry.    Data Reviewed PET/CT showed no other areas of known metastatic disease. MRI of the brain pending.  Assessment    Recurrent malignant melanoma.  Radial nerve palsy postbiopsy.    Plan    The wound dressing can  be a simple gauze at this time. He may shower. He'll continue followup with orthopedics.  The patient will likely benefit from radiation therapy, and transportation issues which are the wife's main concern at this time are not insurmountable.  Plan for followup exam in 2 weeks.    PCP and Ref MD: [name] 02/02/2014, 9:36 AM

## 2014-02-02 NOTE — Patient Instructions (Signed)
Patient to return in 2 weeks for follow up. The patient is aware to call back for any questions or concerns.  

## 2014-02-04 ENCOUNTER — Encounter: Payer: Self-pay | Admitting: General Surgery

## 2014-02-04 LAB — PROTIME-INR
INR: 1.5
Prothrombin Time: 17.9 secs — ABNORMAL HIGH (ref 11.5–14.7)

## 2014-02-04 LAB — CBC CANCER CENTER
BASOS PCT: 1.2 %
Basophil #: 0.1 x10 3/mm (ref 0.0–0.1)
EOS ABS: 0.4 x10 3/mm (ref 0.0–0.7)
Eosinophil %: 7.1 %
HCT: 38.2 % — ABNORMAL LOW (ref 40.0–52.0)
HGB: 12.1 g/dL — AB (ref 13.0–18.0)
LYMPHS ABS: 1 x10 3/mm (ref 1.0–3.6)
LYMPHS PCT: 20.6 %
MCH: 29.5 pg (ref 26.0–34.0)
MCHC: 31.8 g/dL — ABNORMAL LOW (ref 32.0–36.0)
MCV: 93 fL (ref 80–100)
Monocyte #: 0.5 x10 3/mm (ref 0.2–1.0)
Monocyte %: 9.3 %
Neutrophil #: 3.1 x10 3/mm (ref 1.4–6.5)
Neutrophil %: 61.8 %
Platelet: 199 x10 3/mm (ref 150–440)
RBC: 4.11 10*6/uL — AB (ref 4.40–5.90)
RDW: 14.4 % (ref 11.5–14.5)
WBC: 5 x10 3/mm (ref 3.8–10.6)

## 2014-02-07 ENCOUNTER — Encounter: Payer: Self-pay | Admitting: General Surgery

## 2014-02-07 LAB — CANCER CTR PLATELET CT: PLATELETS: 208 x10 3/mm (ref 150–440)

## 2014-02-07 LAB — PROTIME-INR
INR: 1.7
PROTHROMBIN TIME: 19.9 s — AB (ref 11.5–14.7)

## 2014-02-09 ENCOUNTER — Encounter: Payer: Self-pay | Admitting: General Surgery

## 2014-02-11 LAB — PROTIME-INR
INR: 1.8
Prothrombin Time: 20.4 secs — ABNORMAL HIGH (ref 11.5–14.7)

## 2014-02-14 LAB — PROTIME-INR
INR: 2.2
Prothrombin Time: 23.8 secs — ABNORMAL HIGH (ref 11.5–14.7)

## 2014-02-16 ENCOUNTER — Encounter: Payer: Self-pay | Admitting: General Surgery

## 2014-02-16 ENCOUNTER — Ambulatory Visit (INDEPENDENT_AMBULATORY_CARE_PROVIDER_SITE_OTHER): Payer: Self-pay | Admitting: General Surgery

## 2014-02-16 VITALS — BP 118/78 | HR 66 | Resp 14 | Ht 71.0 in | Wt 135.0 lb

## 2014-02-16 DIAGNOSIS — R2231 Localized swelling, mass and lump, right upper limb: Secondary | ICD-10-CM

## 2014-02-16 DIAGNOSIS — R229 Localized swelling, mass and lump, unspecified: Secondary | ICD-10-CM

## 2014-02-16 NOTE — Patient Instructions (Signed)
Patient to return in 2 months for follow up. The patient is aware to call back for any questions or concerns.  

## 2014-02-16 NOTE — Progress Notes (Signed)
Patient ID: Randy Mcknight, male   DOB: April 30, 1931, 80 y.o.   MRN: [id number]  Chief Complaint  Patient presents with  . Routine Post Op    right arm excision     HPI Randy Mcknight is a 80 y.o. male here today for his post op right arm excision done on 01/21/14. Patient states he is doing well. Patient is to start Radiation Therapy today.   HPI  Past Medical History  Diagnosis Date  . Arthritis   . Hernia   . Atrial fibrillation   . Parkinson disease   . Glaucoma 2005  . Cancer     Basal cell melanoma  . Malignant melanoma of skin of upper limb, including shoulder December 29, 2007    wide excision Clark level IV,, 3 mm deep malignant melanoma,, negative margins, negative sentinel nodes.pT3a,N0    Past Surgical History  Procedure Laterality Date  . Lymph node dissection  2009  . Eye surgery Bilateral   . Melanoma excision  2009  . Replacement total knee Right 2005  . Mass excision Right 01-21-14    right upper arm    Family History  Problem Relation Age of Onset  . Stroke Mother     Social History History  Substance Use Topics  . Smoking status: Never Smoker   . Smokeless tobacco: Never Used  . Alcohol Use: No    Allergies  Allergen Reactions  . Latex   . Ivp Dye [Iodinated Diagnostic Agents] Rash    Current Outpatient Prescriptions  Medication Sig Dispense Refill  . brimonidine (ALPHAGAN P) 0.1 % SOLN Place 1 drop into both eyes 2 (two) times daily.      . carbidopa-levodopa (SINEMET IR) 25-100 MG per tablet Take 2 tablets by mouth 3 (three) times daily.       Marland Kitchen COUMADIN 3 MG tablet       . donepezil (ARICEPT) 10 MG tablet Take 10 mg by mouth at bedtime.      . dorzolamide (TRUSOPT) 2 % ophthalmic solution 1 drop 2 (two) times daily.      Marland Kitchen latanoprost (XALATAN) 0.005 % ophthalmic solution Place 1 drop into both eyes at bedtime.      Marland Kitchen LOVENOX 30 MG/0.3ML injection        No current facility-administered medications for this visit.    Review of  Systems Review of Systems  Constitutional: Negative.   Respiratory: Negative.   Cardiovascular: Negative.     Blood pressure 118/78, pulse 66, resp. rate 14, height 5\' 11"  (1.803 m), weight 135 lb (61.236 kg).  Physical Exam Physical Exam  Constitutional: He is oriented to person, place, and time. He appears well-developed and well-nourished.  Neurological: He is alert and oriented to person, place, and time.  Skin: Skin is warm and dry.   Examination shows no improvement and extensor function of the right hand.  The right upper arm lesion appears somewhat larger, this may be secondary to institution of anticoagulation therapy. Minimal bruising noted. No drainage.   Assessment    Recurrent melanoma right upper arm.  Radial nerve palsy.    Plan    The patient continues to make use of a splint, and the wrist range of motion (passive) is normal. 2 continue to follow up with Earnestine Leys M.D.  We'll plan for a followup examination here in 2 months.    PCP/Ref MD: [name] 02/17/2014, 3:41 PM

## 2014-02-21 LAB — CBC CANCER CENTER
BASOS ABS: 0.1 x10 3/mm (ref 0.0–0.1)
Basophil %: 0.9 %
EOS PCT: 6.6 %
Eosinophil #: 0.4 x10 3/mm (ref 0.0–0.7)
HCT: 43.9 % (ref 40.0–52.0)
HGB: 14.1 g/dL (ref 13.0–18.0)
Lymphocyte #: 1.6 x10 3/mm (ref 1.0–3.6)
Lymphocyte %: 27.2 %
MCH: 29.6 pg (ref 26.0–34.0)
MCHC: 32.1 g/dL (ref 32.0–36.0)
MCV: 92 fL (ref 80–100)
MONO ABS: 0.5 x10 3/mm (ref 0.2–1.0)
Monocyte %: 9.4 %
Neutrophil #: 3.2 x10 3/mm (ref 1.4–6.5)
Neutrophil %: 55.9 %
Platelet: 157 x10 3/mm (ref 150–440)
RBC: 4.75 10*6/uL (ref 4.40–5.90)
RDW: 14.8 % — ABNORMAL HIGH (ref 11.5–14.5)
WBC: 5.7 x10 3/mm (ref 3.8–10.6)

## 2014-02-21 LAB — PROTIME-INR
INR: 1.8
PROTHROMBIN TIME: 20.5 s — AB (ref 11.5–14.7)

## 2014-02-24 ENCOUNTER — Ambulatory Visit: Payer: Self-pay | Admitting: Internal Medicine

## 2014-03-03 LAB — CBC CANCER CENTER
BASOS PCT: 1.2 %
Basophil #: 0.1 x10 3/mm (ref 0.0–0.1)
EOS ABS: 0.4 x10 3/mm (ref 0.0–0.7)
Eosinophil %: 6.7 %
HCT: 40.1 % (ref 40.0–52.0)
HGB: 12.8 g/dL — ABNORMAL LOW (ref 13.0–18.0)
LYMPHS ABS: 1.1 x10 3/mm (ref 1.0–3.6)
LYMPHS PCT: 19.7 %
MCH: 29.4 pg (ref 26.0–34.0)
MCHC: 31.9 g/dL — AB (ref 32.0–36.0)
MCV: 92 fL (ref 80–100)
MONO ABS: 0.6 x10 3/mm (ref 0.2–1.0)
Monocyte %: 10.4 %
NEUTROS PCT: 62 %
Neutrophil #: 3.3 x10 3/mm (ref 1.4–6.5)
Platelet: 163 x10 3/mm (ref 150–440)
RBC: 4.36 10*6/uL — ABNORMAL LOW (ref 4.40–5.90)
RDW: 14.3 % (ref 11.5–14.5)
WBC: 5.4 x10 3/mm (ref 3.8–10.6)

## 2014-03-14 LAB — PROTIME-INR
INR: 2.1
PROTHROMBIN TIME: 23.4 s — AB (ref 11.5–14.7)

## 2014-03-21 LAB — CBC CANCER CENTER
Basophil #: 0 x10 3/mm (ref 0.0–0.1)
Basophil %: 0.8 %
EOS ABS: 0.3 x10 3/mm (ref 0.0–0.7)
Eosinophil %: 5.9 %
HCT: 43.7 % (ref 40.0–52.0)
HGB: 14 g/dL (ref 13.0–18.0)
LYMPHS ABS: 1.1 x10 3/mm (ref 1.0–3.6)
LYMPHS PCT: 19.4 %
MCH: 29.5 pg (ref 26.0–34.0)
MCHC: 32.1 g/dL (ref 32.0–36.0)
MCV: 92 fL (ref 80–100)
MONOS PCT: 10.7 %
Monocyte #: 0.6 x10 3/mm (ref 0.2–1.0)
NEUTROS PCT: 63.2 %
Neutrophil #: 3.5 x10 3/mm (ref 1.4–6.5)
PLATELETS: 177 x10 3/mm (ref 150–440)
RBC: 4.75 10*6/uL (ref 4.40–5.90)
RDW: 14.5 % (ref 11.5–14.5)
WBC: 5.6 x10 3/mm (ref 3.8–10.6)

## 2014-03-27 ENCOUNTER — Ambulatory Visit: Payer: Self-pay | Admitting: Internal Medicine

## 2014-04-13 ENCOUNTER — Encounter: Payer: Self-pay | Admitting: General Surgery

## 2014-04-13 ENCOUNTER — Ambulatory Visit (INDEPENDENT_AMBULATORY_CARE_PROVIDER_SITE_OTHER): Payer: Self-pay | Admitting: General Surgery

## 2014-04-13 VITALS — BP 110/58 | HR 66 | Resp 16 | Ht 71.0 in | Wt 135.0 lb

## 2014-04-13 DIAGNOSIS — C439 Malignant melanoma of skin, unspecified: Secondary | ICD-10-CM

## 2014-04-13 DIAGNOSIS — C799 Secondary malignant neoplasm of unspecified site: Secondary | ICD-10-CM

## 2014-04-13 LAB — PROTIME-INR
INR: 2.1
Prothrombin Time: 22.9 s — ABNORMAL HIGH

## 2014-04-13 NOTE — Patient Instructions (Signed)
Patient to return as needed. The patient is aware to call back for any questions or concerns. 

## 2014-04-13 NOTE — Progress Notes (Signed)
Patient ID: Randy Mcknight, male   DOB: Sep 09, 1930, 80 y.o.   MRN: [id number]  Chief Complaint  Patient presents with  . Follow-up    right arm melanoma    HPI Randy Mcknight is a 80 y.o. male who presents for a follow up of melanoma on the right arm. The patient denies any pain. He is significantly limited in his activity since the radial nerve injury sustained during biopsy of the recurrent tumor mass. He has not shown any improvement with ongoing physical therapy. He is making use of a wrist brace to minimize contracture. Because of his Parkinson's disease he is having significant difficulty moving from chair to the standing position. He is not able to feed himself with his left arm due to his Parkinson's disease. His wife has arranged for home health to help with bathing.Marland Kitchen He has completed radiation therapy which he tolerated without effect.   HPI  Past Medical History  Diagnosis Date  . Arthritis   . Hernia   . Atrial fibrillation   . Parkinson disease   . Glaucoma 2005  . Cancer     Basal cell melanoma  . Malignant melanoma of skin of upper limb, including shoulder December 29, 2007    wide excision Clark level IV,, 3 mm deep malignant melanoma,, negative margins, negative sentinel nodes.pT3a,N0    Past Surgical History  Procedure Laterality Date  . Lymph node dissection  2009  . Eye surgery Bilateral   . Melanoma excision  2009  . Replacement total knee Right 2005  . Mass excision Right 01-21-14    right upper arm    Family History  Problem Relation Age of Onset  . Stroke Mother     Social History History  Substance Use Topics  . Smoking status: Never Smoker   . Smokeless tobacco: Never Used  . Alcohol Use: No    Allergies  Allergen Reactions  . Latex   . Ivp Dye [Iodinated Diagnostic Agents] Rash    Current Outpatient Prescriptions  Medication Sig Dispense Refill  . brimonidine (ALPHAGAN P) 0.1 % SOLN Place 1 drop into both eyes 2 (two) times  daily.    . carbidopa-levodopa (SINEMET IR) 25-100 MG per tablet Take 2 tablets by mouth 3 (three) times daily.     Marland Kitchen COUMADIN 3 MG tablet     . donepezil (ARICEPT) 10 MG tablet Take 10 mg by mouth at bedtime.    . dorzolamide (TRUSOPT) 2 % ophthalmic solution 1 drop 2 (two) times daily.    Marland Kitchen latanoprost (XALATAN) 0.005 % ophthalmic solution Place 1 drop into both eyes at bedtime.    Marland Kitchen LOVENOX 30 MG/0.3ML injection      No current facility-administered medications for this visit.    Review of Systems Review of Systems  Constitutional: Negative.   Respiratory: Negative.   Cardiovascular: Negative.     Blood pressure 110/58, pulse 66, resp. rate 16, height $RemoveBe'5\' 11"'kuykwqIKj$  (1.803 m), weight 135 lb (61.236 kg).  Physical Exam Physical Exam  Constitutional: He is oriented to person, place, and time. He appears well-developed and well-nourished.  Musculoskeletal:       Arms: Lymphadenopathy:    He has no cervical adenopathy.    He has no axillary adenopathy.  Neurological: He is alert and oriented to person, place, and time.  Skin: Skin is warm and dry.    Assessment    Recurrent malignant melanoma of the right upper extremity. Radial nerve injury secondary to biopsy.  Plan    The patient's wife reports that he met with Randy Mcknight, M.D. from orthopedics. At this time there is not much talk about doing tendon transfers.  The patient has shown essentially no recovery suggesting the radial nerve has been severely traumatized or divided.  Further recommendations for upper extremity rehabilitation will come from Dr. Sabra Mcknight. The family were encouraged to call should they have other questions.    PCP:  Randy Mcknight 04/13/2014, 9:02 PM

## 2014-04-26 ENCOUNTER — Ambulatory Visit: Payer: Self-pay | Admitting: Internal Medicine

## 2014-05-26 LAB — PROTIME-INR
INR: 2.3
PROTHROMBIN TIME: 24.8 s — AB (ref 11.5–14.7)

## 2014-05-27 ENCOUNTER — Ambulatory Visit: Payer: Self-pay | Admitting: Internal Medicine

## 2014-07-25 ENCOUNTER — Ambulatory Visit: Payer: Self-pay | Admitting: Internal Medicine

## 2014-08-31 ENCOUNTER — Ambulatory Visit
Admit: 2014-08-31 | Disposition: A | Discharge status: Home / Self Care | Payer: Self-pay | Attending: Internal Medicine | Admitting: Internal Medicine

## 2014-09-09 ENCOUNTER — Ambulatory Visit
Admit: 2014-09-09 | Disposition: A | Discharge status: Home / Self Care | Payer: Self-pay | Attending: Internal Medicine | Admitting: Internal Medicine

## 2014-09-09 LAB — LACTATE DEHYDROGENASE: LDH: 127 U/L

## 2014-09-09 LAB — CBC CANCER CENTER
BASOS ABS: 0.1 x10 3/mm (ref 0.0–0.1)
Basophil %: 1.3 %
EOS ABS: 0.2 x10 3/mm (ref 0.0–0.7)
Eosinophil %: 3.9 %
HCT: 41.2 % (ref 40.0–52.0)
HGB: 13.4 g/dL (ref 13.0–18.0)
LYMPHS ABS: 0.8 x10 3/mm — AB (ref 1.0–3.6)
LYMPHS PCT: 20.2 %
MCH: 29.1 pg (ref 26.0–34.0)
MCHC: 32.6 g/dL (ref 32.0–36.0)
MCV: 89 fL (ref 80–100)
MONOS PCT: 9.4 %
Monocyte #: 0.4 x10 3/mm (ref 0.2–1.0)
Neutrophil #: 2.7 x10 3/mm (ref 1.4–6.5)
Neutrophil %: 65.2 %
Platelet: 175 x10 3/mm (ref 150–440)
RBC: 4.62 10*6/uL (ref 4.40–5.90)
RDW: 14.6 % — AB (ref 11.5–14.5)
WBC: 4.1 x10 3/mm (ref 3.8–10.6)

## 2014-09-09 LAB — HEPATIC FUNCTION PANEL A (ARMC)
AST: 15 U/L
Albumin: 3.4 g/dL — ABNORMAL LOW
Alkaline Phosphatase: 78 U/L
BILIRUBIN DIRECT: 0.1 mg/dL
BILIRUBIN TOTAL: 0.5 mg/dL
Indirect Bilirubin: 0.4
TOTAL PROTEIN: 6.7 g/dL

## 2014-09-09 LAB — CREATININE, SERUM
Creatinine: 0.82 mg/dL
EGFR (African American): >60
EGFR (Non-African Amer.): >60

## 2014-09-17 NOTE — Consult Note (Signed)
Reason for Visit: This 81 year old Male Randy Mcknight presents to the clinic for initial evaluation of  recurrent malignant melanoma .   Referred by Dr. Cynda Acres.  Diagnosis:  Chief Complaint/Diagnosis   81 year old male with multiple comorbidities including advanced Parkinson's with recurrent malignant melanoma with bone involvement from the right medial tricep  Pathology Report pathology report reviewed   Imaging Report PET CT scan reviewed   Referral Report clinical notes reviewed   Planned Treatment Regimen palliative radiation therapy   HPI   Randy Mcknight is an 81 year old male initially presented back in 2009 with a 3 mm thickness Clark's level IV malignant melanoma. Margins were positive and deep and lateral borders. Had a sentinel node biopsy showing no evidence of malignancy 2 nodes examined wider excision of the right upper arm showed no residual melanoma with all margins clear. He recently presented with a right triceps mass was seen by Dr. Tollie Pizza and again positive for melanoma. PET CT scan shows this area to be the only site showing hypermetabolic activity with focal invasion and instruction of the humerus. He is scheduled for an MRI of the brain and that is pending. He recently developed right hand drop secondary to his recent surgery and is going to physical therapy for that. He otherwise has significant comorbidities includingParkinson's disease atrial fibrillation. He seen today regarding palliative radiation therapy.  Past Hx:    glaucoma:    parkinson's:    HTN:    afib:    TKR Right: 2005   melanoma R forearm, L cheek:   Past, Family and Social History:  Past Medical History positive   Cardiovascular atrial fibrillation; hypertension   Neurological/Psychiatric Parkinson's disease   Past Surgical History right total knee replacement, excision of right forearm melanoma   Past Medical History Comments glaucoma   Family History noncontributory   Social History  noncontributory   Additional Past Medical and Surgical History accompanied by wife and son today   Allergies:   IVP Dye: Other  Home Meds:  Home Medications: Medication Instructions Status  brimonidine ophthalmic 0.2% ophthalmic solution 1 drop(s) to each eye 2 times a day Active  latanoprost ophthalmic 0.005% ophthalmic solution 1 drop(s) to each eye once a day (at bedtime) Active  warfarin 3 mg oral tablet 1 tab(s) orally once a day Tues, Thurs, Sat, Sun Active  warfarin 4 mg oral tablet 1 tab(s) orally once a day Mon, Wed, Friday Active  carbidopa-levodopa 25 mg-100 mg oral tablet, extended release 1 tab(s) orally 3 times a day Active  carbidopa-levodopa 25 mg-100 mg oral tablet 2 tab(s) orally 3 times a day Active  donepezil 10 mg oral tablet 0.5 tab(s) orally once a day (at bedtime) Active  dorzolamide-timolol ophthalmic 2.23%-0.68% ophthalmic solution 1 drop(s) to each eye 2 times a day Active   Review of Systems:  General negative   Performance Status (ECOG) 0   Skin see HPI   Breast negative   Ophthalmologic negative   ENMT negative   Respiratory and Thorax negative   Cardiovascular negative   Gastrointestinal negative   Genitourinary negative   Musculoskeletal negative   Neurological see HPI   Psychiatric negative   Hematology/Lymphatics negative   Endocrine negative   Allergic/Immunologic negative   Review of Systems   complaints associated with advanced Parkinson disease wheelchair bound and right hand drop  Nursing Notes:  Nursing Vital Signs and Chemo Nursing Nursing Notes: *CC Vital Signs Flowsheet:   14-Sep-15 10:37  Temp Temperature 97.4  Pulse Pulse  55  Respirations Respirations 20  SBP SBP 131  DBP DBP 76  Pain Scale (0-10)  0  Current Weight (kg) (kg) 61.7  Height (cm) centimeters 182.9  BSA (m2) 1.8   Physical Exam:  General/Skin/HEENT:  Eyes normal   ENMT normal   Head and Neck normal   Additional PE a well-developed  frail wheelchair-bound male in NAD. Lungs are clear to A&P cardiac examination shows irregular irregular heartbeat. He does have right hand drop. Has recent excisional site in the right medial triceps region which is healing well consistent with his recent surgical exploration. He does have some thickening of the skin surrounding this area.no evidence of right axillary or supraclavicular adenopathy is appreciated.   Breasts/Resp/CV/GI/GU:  Respiratory and Thorax normal   Cardiovascular normal   Gastrointestinal normal   Genitourinary normal   MS/Neuro/Psych/Lymph:  Musculoskeletal normal   Lymphatics normal   Other Results:  Radiology Results: LabUnknown:    13-May-14 10:07, KDRHUMERUS  KDRHUMERUS   REASON FOR EXAM:    fracture injury 09/20/2012  COMMENTS:       PROCEDURE: KDR - KDXR HUMERUS RIGHT  - Oct 06 2012 10:07AM     RESULT: There severe degenerative changes in the right shoulder with   deformity of the humeral head and glenoid with flattening and   obliteration of the joint space. Hypertrophic spurring is present. The   humeral head appears to be somewhat high in position. Lucency projects in   the proximal right humerus as noted on the previous study consistent with   a nondisplaced fracture. The distal humerus appears to be unremarkable.    IMPRESSION:  Severe degenerative changes in right shoulder with proximal   right humeral fracture. The appearance is unchanged when compared to the   study of 20 September 2012.  Dictation Site: 2        Verified By: Sundra Aland, M.D., MD  PACS Image     03-Sep-15 11:30, PET/CT Scan Melanoma Restage  PACS Image   Nuclear Med:  PET/CT Scan Melanoma Restage   REASON FOR EXAM:    malignant melanoma  COMMENTS:       PROCEDURE: PET - PET/CT MELANOMA RESTG WB  - Jan 27 2014 11:30AM     CLINICAL DATA:  Subsequent treatment strategy for malignant  melanoma.    EXAM:  NUCLEAR MEDICINE PET SKULL BASE TO  THIGH    TECHNIQUE:  12.75 mCi F-18 FDG was injected intravenously. Full-ring PET imaging  was performed from the skull base to thigh after the radiotracer. CT  data was obtained and used for attenuation correction and anatomic  localization.    FASTING BLOOD GLUCOSE:  Value: 94 mg/dl    COMPARISON:  PET-CT dated 12/10/2007.    FINDINGS:  NECK    No hypermetabolic lymph nodes in the neck.    CHEST    No hypermetabolic mediastinal or hilar nodes.  No suspicious pulmonary nodules on the CT scan.    Coronary atherosclerosis.  Pectus deformity.    ABDOMEN/PELVIS    No abnormal hypermetabolic activity within the liver, pancreas,  adrenal glands, or spleen.    No hypermetabolic lymph nodes in the abdomen or pelvis.    Vascular calcifications. 4 mm nonobstructing left lower pole renal  cyst (series 3/image 9). Left renal cysts and left renal sinus  cysts. Large left scrotal hernia containing multiple loops of bowel.  SKELETON    2.0 x 2.2 cm soft tissue lesion along the posterolateral aspect  of  the distal upper right arm (series 3, image 154), max SUV 7.7,  suspicious for metastasis. Associated osseous destruction along the  lateral aspect of the distal humeral shaft (series 3/ image 155).  Overlying skin staples are likely related to recent biopsy (series  3/image 153).    Degenerative changes of the visualized thoracolumbar spine.     IMPRESSION:  2.2 cm hypermetabolic soft tissue lesion in the posterolateral  distal upper arm with osseous destruction of the distal humeral  shaft, suspicious for metastasis.  Electronically Signed    By: Julian Hy M.D.    On: 01/27/2014 13:25         Verified By: Julian Hy, M.D.,   Relevent Results:   Relevant Scans and Labs PET CT scan and plain films are reviewed   Assessment and Plan: Impression:   a recurrent malignant melanoma of right triceps region in 81 year old male with significant  comorbidities. Plan:   at this time other to go ahead with palliative radiation therapy to his area of primary recurrent melanoma. Although a slightly higher dose than normal for palliation since this is the only site of melanoma based on his recent PET/CT scan. Should his MRI of brain show any evidence of disease certainly treatment course we'll change. I would treat this area to 5100 cGy in 17 fractions. Risks and benefits of treatment including skin reaction, fatigue, underlying fibrosis of the scar site all were discussed in detail with the Randy Mcknight and his family. They all seem to comprehend my treatment plan well. I have set him up for CT simulation later this week. Case was discussed personally with medical oncology.  I would like to take this opportunity for allowing me to participate in the care of your Randy Mcknight..  Fax to Physician:  Physicians To Recieve Fax: Birdie Sons, MD - [phone].  Electronic Signatures: Miryah Ralls, Roda Shutters (MD)  (Signed 14-Sep-15 12:22)  Authored: HPI, Diagnosis, Past Hx, PFSH, Allergies, Home Meds, ROS, Nursing Notes, Physical Exam, Other Results, Relevent Results, Encounter Assessment and Plan, Fax to Physician   Last Updated: 14-Sep-15 12:22 by Armstead Peaks (MD)

## 2014-09-17 NOTE — Op Note (Signed)
PATIENT NAME:  Randy Mcknight, Randy Mcknight MR#:  [id number] DATE OF BIRTH:  12-Jan-1931  DATE OF PROCEDURE:  01/21/2014  PREOPERATIVE DIAGNOSIS:  Metastatic melanoma to the right upper arm.   POSTOPERATIVE DIAGNOSIS: Metastatic melanoma to the right upper arm.   OPERATIVE PROCEDURE: Open biopsy of the right upper arm, posterolateral aspect.   SURGEON: Robert Bellow, MD   ANESTHESIA: General by LMA; Marcaine 0.5% with 100,000 units of epinephrine with 30 mL local infiltration.   ESTIMATED BLOOD LOSS: 25 mL   CLINICAL NOTE: This 81 year old male is now 6+ years status post excision of a melanoma from the olecranon area of the right upper extremity. Sentinel node biopsy at that time was negative, and there was a negative metastatic workup. He was not a candidate for additional therapy. He has recently been identified with a mass on the right upper posterolateral arm. FNA was suspicious, but not confirmatory for metastatic melanoma. He was felt to be a candidate for biopsy.   OPERATIVE NOTE: With the patient under adequate general anesthesia, the area was prepped with ChloraPrep, and the arm supported on a cross table armboard, appropriately padded. The area was approached through a longitudinal incision on the posterior lateral aspect of the mid/distal right upper arm. The area was infiltrated with Marcaine with epinephrine for postoperative analgesia, as well as hemostasis. A longitudinal incision was made and carried down through the skin and subcutaneous tissue. The muscle bellies were spread apart to expose an extremely friable vascular mass. A clear plane of dissection around the muscles was not evident. The biopsy consisted of an approximately 1 x 2.5 x 1 cm block of tissue. Generous oozing was evident. This was controlled with 3-0 Vicryl suture ligatures, as well as the use of Surgicel. The wound was closed in layers with 3-0 Vicryl to the muscular fascia, followed by a similar sutures to the  adipose layer. The skin was closed with staples. A compressive dressing with Telfa, gauze pads, Kerlix and light Ace wrap was then applied.   The patient was taken to the recovery room in stable condition.   ____________________________ Robert Bellow, MD jwb:MT D: 01/27/2014 19:50:25 ET T: 01/28/2014 07:00:24 ET JOB#: [id number]  cc: Robert Bellow, MD, <Dictator> Kirstie Peri. Caryn Section, MD JEFFREY Amedeo Kinsman MD ELECTRONICALLY SIGNED 01/28/2014 17:36

## 2014-10-26 ENCOUNTER — Other Ambulatory Visit: Payer: Self-pay | Admitting: Family Medicine

## 2014-10-26 ENCOUNTER — Ambulatory Visit: Payer: Medicare Other

## 2014-10-27 ENCOUNTER — Telehealth: Payer: Self-pay

## 2014-10-27 LAB — PROTIME-INR
INR: 2.3 — ABNORMAL HIGH (ref 0.8–1.2)
Prothrombin Time: 24.3 s — ABNORMAL HIGH (ref 9.1–12.0)

## 2014-10-27 NOTE — Telephone Encounter (Signed)
PT/INR results   INR 2.3  PT 24.3

## 2014-10-27 NOTE — Telephone Encounter (Signed)
PT/INR is normal at 2.3. Continue current dose of coumadin and recheck in 1 months.

## 2014-10-28 NOTE — Telephone Encounter (Signed)
Talked to Mr Roppolo's wife and gave her the PT/INR results,  She verbally understood of the PT/INR results.  INR 2.3 and to continue current dose and to come back for a PT/INR check in one month.   I tried to transfer the call to front desk to make the appointment but got no answer, pt's wife agreed to call back on Monday June 6 to make the appointment for 4 weeks.

## 2014-10-28 NOTE — Telephone Encounter (Signed)
Patient notified of results.urse visit scheduled.

## 2014-11-09 ENCOUNTER — Observation Stay
Admission: EM | Admit: 2014-11-09 | Discharge: 2014-11-11 | Disposition: A | Discharge status: Skilled Nursing Facility | Payer: Medicare Other | Attending: Specialist | Admitting: Specialist

## 2014-11-09 DIAGNOSIS — R7989 Other specified abnormal findings of blood chemistry: Secondary | ICD-10-CM | POA: Insufficient documentation

## 2014-11-09 DIAGNOSIS — R748 Abnormal levels of other serum enzymes: Secondary | ICD-10-CM | POA: Diagnosis not present

## 2014-11-09 DIAGNOSIS — G930 Cerebral cysts: Secondary | ICD-10-CM | POA: Insufficient documentation

## 2014-11-09 DIAGNOSIS — G20 Parkinson's disease: Secondary | ICD-10-CM | POA: Diagnosis not present

## 2014-11-09 DIAGNOSIS — R0902 Hypoxemia: Secondary | ICD-10-CM | POA: Diagnosis not present

## 2014-11-09 DIAGNOSIS — Z7901 Long term (current) use of anticoagulants: Secondary | ICD-10-CM | POA: Insufficient documentation

## 2014-11-09 DIAGNOSIS — I951 Orthostatic hypotension: Secondary | ICD-10-CM | POA: Diagnosis not present

## 2014-11-09 DIAGNOSIS — Z91040 Latex allergy status: Secondary | ICD-10-CM | POA: Insufficient documentation

## 2014-11-09 DIAGNOSIS — Z79899 Other long term (current) drug therapy: Secondary | ICD-10-CM | POA: Diagnosis not present

## 2014-11-09 DIAGNOSIS — I482 Chronic atrial fibrillation: Secondary | ICD-10-CM | POA: Diagnosis not present

## 2014-11-09 DIAGNOSIS — Z91041 Radiographic dye allergy status: Secondary | ICD-10-CM | POA: Diagnosis not present

## 2014-11-09 DIAGNOSIS — Z85820 Personal history of malignant melanoma of skin: Secondary | ICD-10-CM | POA: Insufficient documentation

## 2014-11-09 DIAGNOSIS — M1990 Unspecified osteoarthritis, unspecified site: Secondary | ICD-10-CM | POA: Insufficient documentation

## 2014-11-09 DIAGNOSIS — R531 Weakness: Secondary | ICD-10-CM | POA: Insufficient documentation

## 2014-11-09 DIAGNOSIS — H409 Unspecified glaucoma: Secondary | ICD-10-CM | POA: Diagnosis not present

## 2014-11-09 DIAGNOSIS — R55 Syncope and collapse: Secondary | ICD-10-CM | POA: Diagnosis present

## 2014-11-09 DIAGNOSIS — R778 Other specified abnormalities of plasma proteins: Secondary | ICD-10-CM | POA: Insufficient documentation

## 2014-11-09 NOTE — ED Notes (Signed)
Pt to ED via [facility] d/t a syncopal episode. Per EMS, pt was ambulating to the BR where he was found by his wife on the floor. Unknown if the pt actually fell or slipped off the toilet. EMS reports a witnessed syncopal episode after their arrival where the pt experienced a LOC for a short few moments. Per EMS, VS PTA are: HR 70's, BP 100/65, 93% O2 on RA, and a CBG of 179. EMS reports the pt has been completely A&O since the incident.

## 2014-11-10 ENCOUNTER — Observation Stay: Payer: Medicare Other

## 2014-11-10 ENCOUNTER — Emergency Department: Payer: Medicare Other

## 2014-11-10 ENCOUNTER — Observation Stay (HOSPITAL_BASED_OUTPATIENT_CLINIC_OR_DEPARTMENT_OTHER)
Admit: 2014-11-10 | Discharge: 2014-11-10 | Disposition: A | Discharge status: Home / Self Care | Payer: Medicare Other | Attending: Physician Assistant | Admitting: Physician Assistant

## 2014-11-10 ENCOUNTER — Telehealth: Payer: Self-pay

## 2014-11-10 DIAGNOSIS — I480 Paroxysmal atrial fibrillation: Secondary | ICD-10-CM

## 2014-11-10 DIAGNOSIS — I951 Orthostatic hypotension: Secondary | ICD-10-CM | POA: Insufficient documentation

## 2014-11-10 DIAGNOSIS — R7989 Other specified abnormal findings of blood chemistry: Secondary | ICD-10-CM | POA: Diagnosis not present

## 2014-11-10 DIAGNOSIS — R55 Syncope and collapse: Secondary | ICD-10-CM

## 2014-11-10 DIAGNOSIS — R778 Other specified abnormalities of plasma proteins: Secondary | ICD-10-CM | POA: Insufficient documentation

## 2014-11-10 LAB — TROPONIN I
Troponin I: 0.03 ng/mL (ref <0.031)
Troponin I: 0.04 ng/mL — ABNORMAL HIGH (ref <0.031)
Troponin I: 0.06 ng/mL — ABNORMAL HIGH (ref <0.031)
Troponin I: <0.03 ng/mL (ref <0.031)

## 2014-11-10 LAB — CBC
HCT: 37.5 % — ABNORMAL LOW (ref 40.0–52.0)
Hemoglobin: 12 g/dL — ABNORMAL LOW (ref 13.0–18.0)
MCH: 28.6 pg (ref 26.0–34.0)
MCHC: 31.9 g/dL — ABNORMAL LOW (ref 32.0–36.0)
MCV: 89.6 fL (ref 80.0–100.0)
Platelets: 215 10*3/uL (ref 150–440)
RBC: 4.18 MIL/uL — ABNORMAL LOW (ref 4.40–5.90)
RDW: 14.4 % (ref 11.5–14.5)
WBC: 10.7 10*3/uL — ABNORMAL HIGH (ref 3.8–10.6)

## 2014-11-10 LAB — URINALYSIS COMPLETE WITH MICROSCOPIC (ARMC ONLY)
Bacteria, UA: NONE SEEN
Bilirubin Urine: NEGATIVE
Glucose, UA: NEGATIVE mg/dL
Hgb urine dipstick: NEGATIVE
Leukocytes, UA: NEGATIVE
Nitrite: NEGATIVE
Protein, ur: 30 mg/dL — AB
Specific Gravity, Urine: 1.02 (ref 1.005–1.030)
Squamous Epithelial / HPF: NONE SEEN
pH: 6 (ref 5.0–8.0)

## 2014-11-10 LAB — COMPREHENSIVE METABOLIC PANEL
ALK PHOS: 82 U/L (ref 38–126)
ALT: <5 U/L — ABNORMAL LOW (ref 17–63)
AST: 20 U/L (ref 15–41)
Albumin: 3.2 g/dL — ABNORMAL LOW (ref 3.5–5.0)
Anion gap: 7 (ref 5–15)
BUN: 18 mg/dL (ref 6–20)
CALCIUM: 8.1 mg/dL — AB (ref 8.9–10.3)
CO2: 23 mmol/L (ref 22–32)
Chloride: 108 mmol/L (ref 101–111)
Creatinine, Ser: 1.06 mg/dL (ref 0.61–1.24)
GFR calc Af Amer: >60 mL/min (ref >60)
GFR calc non Af Amer: >60 mL/min (ref >60)
Glucose, Bld: 176 mg/dL — ABNORMAL HIGH (ref 65–99)
Potassium: 3.8 mmol/L (ref 3.5–5.1)
Sodium: 138 mmol/L (ref 135–145)
Total Bilirubin: 0.7 mg/dL (ref 0.3–1.2)
Total Protein: 6.7 g/dL (ref 6.5–8.1)

## 2014-11-10 LAB — PROTIME-INR
INR: 2.9
INR: 3.11
PROTHROMBIN TIME: 30.4 s — AB (ref 11.4–15.0)
Prothrombin Time: 32.1 seconds — ABNORMAL HIGH (ref 11.4–15.0)

## 2014-11-10 LAB — GLUCOSE, CAPILLARY
Glucose-Capillary: 114 mg/dL — ABNORMAL HIGH (ref 65–99)
Glucose-Capillary: 125 mg/dL — ABNORMAL HIGH (ref 65–99)

## 2014-11-10 MED ORDER — METOPROLOL TARTRATE 25 MG PO TABS
12.5000 mg | ORAL_TABLET | Freq: Two times a day (BID) | ORAL | Status: DC
Start: 2014-11-10 — End: 2014-11-10
  Administered 2014-11-10: 12.5 mg via ORAL
  Filled 2014-11-10: qty 1

## 2014-11-10 MED ORDER — ONDANSETRON HCL 4 MG/2ML IJ SOLN: 4.0000 mg | Freq: Four times a day (QID) | INTRAMUSCULAR | Status: DC | PRN

## 2014-11-10 MED ORDER — DORZOLAMIDE HCL 2 % OP SOLN
1.0000 [drp] | Freq: Two times a day (BID) | OPHTHALMIC | Status: DC
  Administered 2014-11-10 – 2014-11-11 (×3): 1 [drp] via OPHTHALMIC
  Filled 2014-11-10: qty 10

## 2014-11-10 MED ORDER — SODIUM CHLORIDE 0.9 % IJ SOLN
3.0000 mL | Freq: Two times a day (BID) | INTRAMUSCULAR | Status: DC
  Administered 2014-11-10 – 2014-11-11 (×2): 3 mL via INTRAVENOUS

## 2014-11-10 MED ORDER — BRIMONIDINE TARTRATE 0.15 % OP SOLN
1.0000 [drp] | Freq: Two times a day (BID) | OPHTHALMIC | Status: DC
  Administered 2014-11-10 – 2014-11-11 (×3): 1 [drp] via OPHTHALMIC
  Filled 2014-11-10: qty 5

## 2014-11-10 MED ORDER — LATANOPROST 0.005 % OP SOLN
1.0000 [drp] | Freq: Every day | OPHTHALMIC | Status: DC
  Administered 2014-11-10: 1 [drp] via OPHTHALMIC
  Filled 2014-11-10: qty 2.5

## 2014-11-10 MED ORDER — WARFARIN - PHYSICIAN DOSING INPATIENT: Freq: Every day | Status: DC

## 2014-11-10 MED ORDER — ACETAMINOPHEN 650 MG RE SUPP: 650.0000 mg | Freq: Four times a day (QID) | RECTAL | Status: DC | PRN

## 2014-11-10 MED ORDER — CARBIDOPA-LEVODOPA 25-100 MG PO TABS
2.0000 | ORAL_TABLET | Freq: Three times a day (TID) | ORAL | Status: DC
  Administered 2014-11-10 – 2014-11-11 (×5): 2 via ORAL
  Filled 2014-11-10 (×5): qty 2

## 2014-11-10 MED ORDER — ASPIRIN 81 MG PO CHEW
81.0000 mg | CHEWABLE_TABLET | Freq: Once | ORAL | Status: AC
  Administered 2014-11-10: 81 mg via ORAL
  Filled 2014-11-10: qty 1

## 2014-11-10 MED ORDER — FLUDROCORTISONE ACETATE 0.1 MG PO TABS
0.1000 mg | ORAL_TABLET | Freq: Every day | ORAL | Status: DC
  Administered 2014-11-10 – 2014-11-11 (×2): 0.1 mg via ORAL
  Filled 2014-11-10 (×2): qty 1

## 2014-11-10 MED ORDER — MORPHINE SULFATE 2 MG/ML IJ SOLN: 2.0000 mg | INTRAMUSCULAR | Status: DC | PRN

## 2014-11-10 MED ORDER — SODIUM CHLORIDE 0.9 % IV SOLN
INTRAVENOUS | Status: DC
  Administered 2014-11-10 – 2014-11-11 (×3): via INTRAVENOUS

## 2014-11-10 MED ORDER — FLUDROCORTISONE ACETATE 0.1 MG PO TABS: 0.1000 mg | ORAL_TABLET | Freq: Every day | ORAL | Status: DC

## 2014-11-10 MED ORDER — ONDANSETRON HCL 4 MG PO TABS: 4.0000 mg | ORAL_TABLET | Freq: Four times a day (QID) | ORAL | Status: DC | PRN

## 2014-11-10 MED ORDER — WARFARIN SODIUM 1 MG PO TABS: 3.0000 mg | ORAL_TABLET | Freq: Every day | ORAL | Status: DC

## 2014-11-10 MED ORDER — ACETAMINOPHEN 325 MG PO TABS
650.0000 mg | ORAL_TABLET | Freq: Four times a day (QID) | ORAL | Status: DC | PRN
  Administered 2014-11-10: 650 mg via ORAL
  Filled 2014-11-10: qty 2

## 2014-11-10 MED ORDER — DONEPEZIL HCL 5 MG PO TABS
10.0000 mg | ORAL_TABLET | Freq: Every day | ORAL | Status: DC
  Administered 2014-11-10: 10 mg via ORAL
  Filled 2014-11-10: qty 2

## 2014-11-10 NOTE — Progress Notes (Signed)
[facility] [place] NAME: Randy Mcknight    MR#:  [id number]  DATE OF BIRTH:  December 31, 1930  SUBJECTIVE:  Patient is doing okay this morning. Patient's wife is at bedside. Patient is noted to have significant orthostasis.  REVIEW OF SYSTEMS:    Review of Systems  Constitutional: Positive for malaise/fatigue. Negative for fever and chills.  HENT: Negative for sore throat.   Eyes: Negative for blurred vision.  Respiratory: Negative for cough, hemoptysis, shortness of breath and wheezing.   Cardiovascular: Negative for chest pain, palpitations, orthopnea and leg swelling.  Gastrointestinal: Negative for nausea, vomiting, abdominal pain, diarrhea and blood in stool.  Genitourinary: Negative for dysuria.  Musculoskeletal: Negative for back pain.  Neurological: Positive for weakness. Negative for dizziness, tremors and headaches.  Endo/Heme/Allergies: Does not bruise/bleed easily.    Tolerating Diet: Yes      DRUG ALLERGIES:   Allergies  Allergen Reactions  . Latex   . Ivp Dye [Iodinated Diagnostic Agents] Rash    VITALS:  Blood pressure 107/77, pulse 76, temperature 98.4 F (36.9 C), temperature source Oral, resp. rate 20, height 5\' 10"  (1.778 m), weight 56.291 kg (124 lb 1.6 oz), SpO2 98 %.  PHYSICAL EXAMINATION:   Physical Exam  Constitutional: He is oriented to person, place, and time and well-developed, well-nourished, and in no distress. No distress.  HENT:  Head: Normocephalic.  Eyes: No scleral icterus.  Neck: Normal range of motion. Neck supple. No JVD present. No tracheal deviation present.  Cardiovascular: Normal rate, regular rhythm and normal heart sounds.  Exam reveals no gallop and no friction rub.   No murmur heard. Pulmonary/Chest: Effort normal and breath sounds normal. No respiratory distress. He has no wheezes. He has no rales. He exhibits no tenderness.  Abdominal: Soft. Bowel sounds are normal. He  exhibits no distension and no mass. There is no tenderness. There is no rebound and no guarding.  Musculoskeletal: Normal range of motion. He exhibits no edema.  Neurological: He is alert and oriented to person, place, and time.  Skin: Skin is warm. No rash noted. No erythema.  Psychiatric: Affect normal.      LABORATORY PANEL:   CBC  Recent Labs Lab 11/10/14 0010  WBC 10.7*  HGB 12.0*  HCT 37.5*  PLT 215   ------------------------------------------------------------------------------------------------------------------  Chemistries   Recent Labs Lab 11/10/14 0010  NA 138  K 3.8  CL 108  CO2 23  GLUCOSE 176*  BUN 18  CREATININE 1.06  CALCIUM 8.1*  AST 20  ALT <5*  ALKPHOS 82  BILITOT 0.7   ------------------------------------------------------------------------------------------------------------------  Cardiac Enzymes  Recent Labs Lab 11/10/14 0010 11/10/14 0516 11/10/14 1056  TROPONINI <0.03 0.06* 0.04*   ------------------------------------------------------------------------------------------------------------------  RADIOLOGY:  Ct Head Wo Contrast  11/10/2014   CLINICAL DATA:  Syncopal episode, found down by wife on floor. Fall versus slip from toilet. History of Parkinson's disease, cancer, atrial fibrillation.  EXAM: CT HEAD WITHOUT CONTRAST  TECHNIQUE: Contiguous axial images were obtained from the base of the skull through the vertex without intravenous contrast.  COMPARISON:  MRI of the brain February 08, 2014  FINDINGS: The ventricles and sulci are normal for age. No intraparenchymal hemorrhage, mass effect nor midline shift. Patchy supratentorial white matter hypodensities are less than expected for patient's age and though non-specific suggest sequelae of chronic small vessel ischemic disease. No acute large vascular territory infarcts. Small posterior fossa arachnoid cyst without mass effect.  No abnormal extra-axial fluid  collections. Basal  cisterns are patent. Moderate calcific atherosclerosis of the carotid siphons.  No skull fracture. The included ocular globes and orbital contents are non-suspicious. The mastoid aircells and included paranasal sinuses are well-aerated.  IMPRESSION: No acute intracranial process.  Involutional changes. Mild white matter changes suggest chronic small vessel ischemic disease, less than expected for age.   Electronically Signed   By: Elon Alas M.D.   On: 11/10/2014 01:09     ASSESSMENT AND PLAN:   This is an 81 year old male with Parkinson disease who presented with syncope.  1. Syncope: Patient appears to have significant orthostasis. I agree with starting Florinef as ordered by cardiology. 2-D echocardiogram is pending. His troponins are consistent with demand ischemia and not acute coronary syndrome. Instructions on orthostasis was given to the wife.  2. Parkinson's disease: Patient will follow-up with his neurologist. Patient will continue his outpatient medications.  3. Weakness: I will have physical therapy consult for disposition planning. He has no focal neurological deficits. I suspect his generalized weakness is due to his underlying Parkinson's disease.  4. History of atrial fibrillation: Patient is now normal sinus rhythm. Patient's heart rates are controlled. Patient will continue on Coumadin. I have discussed the risk of Coumadin with falls with the patient and his wife. They would like to continue Coumadin. They do acknowledge that if he falls he is at risk of intracranial hemorrhage.   Patient may be able to be discharged later on today. I am awaiting physical therapy consultation.   Management plans discussed with the patient and he is in agreement.  CODE STATUS: full  TOTAL TIME TAKING CARE OF THIS PATIENT: 30 minutes.   Greater than 50% counseling and coordination of care  POSSIBLE D/C , DEPENDING ON CLINICAL CONDITION.   Shakeria Robinette M.D on 11/10/2014 at 11:47  AM  Between 7am to 6pm - Pager - [phone] After 6pm go to [email or web address] - password [email or web address] [facility]  Office  [phone]  CC: Primary care physician; Lelon Huh, MD

## 2014-11-10 NOTE — Progress Notes (Signed)
Called to speak with pharmacist regarding INR  Of 3.11 and patient scheduled for 3mg  po coumadin this evening at 1800, was made aware the pharmacist is out for dinner for the next 40 mins. Called to spoke with dr. Benjie Karvonen to make aware of result, per md hold only this evenings scheduled dose do not discontinue order the pharmacist will continue to adjust and monitor levels [place] [facility]

## 2014-11-10 NOTE — ED Provider Notes (Signed)
[facility] Emergency Department Provider Note  ____________________________________________  Time seen: 12:15 AM  I have reviewed the triage vital signs and the nursing notes.   HISTORY  Chief Complaint Loss of Consciousness      HPI Randy Mcknight is a 81 y.o. male presents with history of 2 syncopal episodes tonight. Circumstances of the first syncopal episodes or as such patient's wife found him adjacent to the toilet unconscious and was aroused by his wife. Second syncopal episode patient became unresponsive for "few moments" that was witnessed by EMS while in route. Patient denies any recollection of the syncopal episodes and denies any complaints at this time no chest pain or shortness of breath no headache or dizziness.    Past Medical History  Diagnosis Date  . Arthritis   . Hernia   . Atrial fibrillation   . Parkinson disease   . Glaucoma 2005  . Cancer     Basal cell melanoma  . Malignant melanoma of skin of upper limb, including shoulder December 29, 2007    wide excision Clark level IV,, 3 mm deep malignant melanoma,, negative margins, negative sentinel nodes.pT3a,N0    Patient Active Problem List   Diagnosis Date Noted  . Syncope 11/10/2014  . Malignant melanoma, metastatic 01/26/2014  . Radial nerve palsy 01/26/2014  . Incarcerated inguinal hernia, unilateral 01/04/2013    Past Surgical History  Procedure Laterality Date  . Lymph node dissection  2009  . Eye surgery Bilateral   . Melanoma excision  2009  . Replacement total knee Right 2005  . Mass excision Right 01-21-14    right upper arm    Current Outpatient Rx  Name  Route  Sig  Dispense  Refill  . brimonidine (ALPHAGAN P) 0.1 % SOLN   Both Eyes   Place 1 drop into both eyes 2 (two) times daily.         . carbidopa-levodopa (SINEMET IR) 25-100 MG per tablet   Oral   Take 2 tablets by mouth 3 (three) times daily.          Marland Kitchen COUMADIN 3 MG tablet               . donepezil (ARICEPT) 10 MG tablet   Oral   Take 10 mg by mouth at bedtime.         . dorzolamide (TRUSOPT) 2 % ophthalmic solution      1 drop 2 (two) times daily.         Marland Kitchen latanoprost (XALATAN) 0.005 % ophthalmic solution   Both Eyes   Place 1 drop into both eyes at bedtime.         Marland Kitchen LOVENOX 30 MG/0.3ML injection                 Allergies Latex and Ivp dye  Family History  Problem Relation Age of Onset  . Stroke Mother     Social History History  Substance Use Topics  . Smoking status: Never Smoker   . Smokeless tobacco: Never Used  . Alcohol Use: No    Review of Systems  Constitutional: Negative for fever. Eyes: Negative for visual changes. ENT: Negative for sore throat. Cardiovascular: Negative for chest pain. Respiratory: Negative for shortness of breath. Gastrointestinal: Negative for abdominal pain, vomiting and diarrhea. Genitourinary: Negative for dysuria. Musculoskeletal: Negative for back pain. Skin: Negative for rash. Neurological: Negative for headaches, focal weakness or numbness.   10-point ROS otherwise negative.  ____________________________________________   PHYSICAL  EXAM:  VITAL SIGNS: ED Triage Vitals  Enc Vitals Group     BP 11/10/14 0003 113/71 mmHg     Pulse Rate 11/10/14 0003 85     Resp 11/10/14 0003 18     Temp 11/10/14 0003 97.1 F (36.2 C)     Temp Source 11/10/14 0003 Axillary     SpO2 11/09/14 2358 93 %     Weight 11/10/14 0003 129 lb 12.8 oz (58.877 kg)     Height 11/10/14 0003 5\' 10"  (1.778 m)     Head Cir --      Peak Flow --      Pain Score 11/10/14 0008 0     Pain Loc --      Pain Edu? --      Excl. in [place]? --      Constitutional: Alert and oriented. Well appearing and in no distress. Eyes: Conjunctivae are normal. PERRL. Normal extraocular movements. ENT   Head: Normocephalic and atraumatic.   Nose: No congestion/rhinnorhea.   Mouth/Throat: Mucous membranes are moist.    Neck: No stridor. Hematological/Lymphatic/Immunilogical: No cervical lymphadenopathy. Cardiovascular: Normal rate, regular rhythm. Normal and symmetric distal pulses are present in all extremities. No murmurs, rubs, or gallops. Respiratory: Normal respiratory effort without tachypnea nor retractions. Breath sounds are clear and equal bilaterally. No wheezes/rales/rhonchi. Gastrointestinal: Soft and nontender. No distention. There is no CVA tenderness. Genitourinary: deferred Musculoskeletal: Nontender with normal range of motion in all extremities. No joint effusions.  No lower extremity tenderness nor edema. Neurologic:  Normal speech and language. No gross focal neurologic deficits are appreciated. Speech is normal.  Skin:  Skin is warm, dry and intact. No rash noted. Psychiatric: Mood and affect are normal. Speech and behavior are normal. Patient exhibits appropriate insight and judgment.  ____________________________________________    LABS (pertinent positives/negatives)  Labs Reviewed  CBC - Abnormal; Notable for the following:    WBC 10.7 (*)    RBC 4.18 (*)    Hemoglobin 12.0 (*)    HCT 37.5 (*)    MCHC 31.9 (*)    All other components within normal limits  GLUCOSE, CAPILLARY - Abnormal; Notable for the following:    Glucose-Capillary 125 (*)    All other components within normal limits  COMPREHENSIVE METABOLIC PANEL - Abnormal; Notable for the following:    Glucose, Bld 176 (*)    Calcium 8.1 (*)    Albumin 3.2 (*)    ALT <5 (*)    All other components within normal limits  TROPONIN I  URINALYSIS COMPLETEWITH MICROSCOPIC ([facility] ONLY)  CBG MONITORING, ED     ____________________________________________   EKG    ____________________________________________    RADIOLOGY  CT scan of the head without contrast revealed no acute intracranial process radiologist     INITIAL IMPRESSION / ASSESSMENT AND PLAN / ED COURSE  Pertinent labs & imaging results that  were available during my care of the patient were reviewed by me and considered in my medical decision making (see chart for details).    ____________________________________________   FINAL CLINICAL IMPRESSION(S) / ED DIAGNOSES  Final diagnoses:  Syncope, unspecified syncope type      Gregor Hams, MD 11/10/14 0230

## 2014-11-10 NOTE — Consult Note (Signed)
Cardiology Consultation Note  Patient ID: Randy Mcknight, MRN: [id number], DOB/AGE: June 20, 1930 81 y.o. Admit date: 11/09/2014   Date of Consult: 11/10/2014 Primary Physician: Lelon Huh, MD Primary Cardiologist: New to [facility]  Chief Complaint: Passed out  Reason for Consult: Syncope with elevated troponin   HPI: 81 y.o. male with h/o PAF on warfarin, Parkinson's disease, basal cell, malignant melanoma of the skin, glaucoma, and arthritis who presented to [facility] on 6/15 after suffering a fall and 2 possible syncopal episodes. He was found to have a mildly elevated troponin of 0.06.   No previous known ischemic evaluations. Last echo from 10/2012 showed an EF of 60-65%, mild aortic sclerosis without stenosis, thickening of the anterior and posterior mitral valve leaflets.   He has known history of Parkinson's disease with associated hypotension with recent blood pressures running in the 83'M systolic. This has recently led to him feeling quite weak to the point that his at home PT has been canceled at times. He recently underwent a PT session the day prior which his wife says made him quite tired. He has not been complaining of any chest pains, palpitations, SOB, diaphoresis, nausea, or vomiting.   He had gotten up from his bed to go to the rest room to have a BM. Upon getting to the restroom he fell. He did not hit his head or suffer LOC. Per both the patient and his wife this episode was not a syncopal event. They worked to get him to a chair. Upon getting him from a laying position to a seating position he had a syncopal episode for an unknown amount of time. EMS was called. Upon EMS arrival when he was again transferred positions he suffered another syncopal episode, again for unknown duration per wife. Per patient he was unaware of any palpitations. No chest pain. He was brought to [facility] for further evaluation.   Upon his arrival he was found to have a troponin of 0.03-->0.06. EKG showed NSR  with sinus arrhythmia, 75 bpm, nonspecific lateral st/t changes. CT head showed no acute intracranial process with involutional changes. Mild white matter changes suggestive of chronic small vessel ischemic disease, less than expected for age. WBC 10.7, hgb 12.0, SCr 1.06, K+ 3.8, glucose upon arrival was 125.   Orthostatics this morning are positive with lying BP 129/67 and sitting BP 107/77. He continues to feel weak.     Past Medical History  Diagnosis Date  . Arthritis   . Hernia   . Atrial fibrillation   . Parkinson disease   . Glaucoma 2005  . Cancer     Basal cell melanoma  . Malignant melanoma of skin of upper limb, including shoulder December 29, 2007    wide excision Clark level IV,, 3 mm deep malignant melanoma,, negative margins, negative sentinel nodes.pT3a,N0      Most Recent Cardiac Studies: Echo 10/2012:   EF 60-65%, aortic sclerosis without stenosis, anterior and posterior mitral valve leaflet thickening.   Surgical History:  Past Surgical History  Procedure Laterality Date  . Lymph node dissection  2009  . Eye surgery Bilateral   . Melanoma excision  2009  . Replacement total knee Right 2005  . Mass excision Right 01-21-14    right upper arm     Home Meds: Prior to Admission medications   Medication Sig Start Date End Date Taking? Authorizing Provider  brimonidine (ALPHAGAN P) 0.1 % SOLN Place 1 drop into both eyes 2 (two) times daily.   Yes  Historical Provider, MD  carbidopa-levodopa (SINEMET IR) 25-100 MG per tablet Take 2 tablets by mouth 3 (three) times daily.  12/30/12  Yes Historical Provider, MD  COUMADIN 3 MG tablet  12/28/13  Yes Historical Provider, MD  donepezil (ARICEPT) 10 MG tablet Take 10 mg by mouth at bedtime.   Yes Historical Provider, MD  dorzolamide (TRUSOPT) 2 % ophthalmic solution 1 drop 2 (two) times daily.   Yes Historical Provider, MD  latanoprost (XALATAN) 0.005 % ophthalmic solution Place 1 drop into both eyes at bedtime.   Yes Historical  Provider, MD  LOVENOX 30 MG/0.3ML injection  01/28/14  Yes Historical Provider, MD    Inpatient Medications:  . brimonidine  1 drop Both Eyes BID  . carbidopa-levodopa  2 tablet Oral TID  . donepezil  10 mg Oral QHS  . dorzolamide  1 drop Both Eyes BID  . latanoprost  1 drop Both Eyes QHS  . metoprolol tartrate  12.5 mg Oral BID  . sodium chloride  3 mL Intravenous Q12H  . warfarin  3 mg Oral q1800   . sodium chloride 75 mL/hr at 11/10/14 0500    Allergies:  Allergies  Allergen Reactions  . Latex   . Ivp Dye [Iodinated Diagnostic Agents] Rash    History   Social History  . Marital Status: Married    Spouse Name: N/A  . Number of Children: N/A  . Years of Education: N/A   Occupational History  . Not on file.   Social History Main Topics  . Smoking status: Never Smoker   . Smokeless tobacco: Never Used  . Alcohol Use: No  . Drug Use: No  . Sexual Activity: Not on file   Other Topics Concern  . Not on file   Social History Narrative     Family History  Problem Relation Age of Onset  . Stroke Mother      Review of Systems: Review of Systems  Constitutional: Positive for weight loss and malaise/fatigue. Negative for fever, chills and diaphoresis.  HENT: Negative for congestion.   Eyes: Positive for blurred vision, discharge and redness. Negative for double vision, photophobia and pain.  Respiratory: Negative for cough, hemoptysis, sputum production, shortness of breath and wheezing.   Cardiovascular: Negative for chest pain, palpitations, orthopnea, claudication, leg swelling and PND.  Gastrointestinal: Negative for heartburn, nausea, vomiting, blood in stool and melena.  Genitourinary: Negative for hematuria.  Musculoskeletal: Positive for falls. Negative for myalgias.  Skin: Negative for itching and rash.  Neurological: Positive for loss of consciousness and weakness. Negative for dizziness, tingling, tremors, speech change, focal weakness and headaches.    Endo/Heme/Allergies: Does not bruise/bleed easily.  Psychiatric/Behavioral: The patient is not nervous/anxious.   All other systems reviewed and are negative.   Labs:  Recent Labs  11/10/14 0010 11/10/14 0516  TROPONINI <0.03 0.06*   Lab Results  Component Value Date   WBC 10.7* 11/10/2014   HGB 12.0* 11/10/2014   HCT 37.5* 11/10/2014   MCV 89.6 11/10/2014   PLT 215 11/10/2014    Recent Labs Lab 11/10/14 0010  NA 138  K 3.8  CL 108  CO2 23  BUN 18  CREATININE 1.06  CALCIUM 8.1*  PROT 6.7  BILITOT 0.7  ALKPHOS 82  ALT <5*  AST 20  GLUCOSE 176*   No results found for: CHOL, HDL, LDLCALC, TRIG No results found for: DDIMER  Radiology/Studies:  Ct Head Wo Contrast  11/10/2014   CLINICAL DATA:  Syncopal episode, found  down by wife on floor. Fall versus slip from toilet. History of Parkinson's disease, cancer, atrial fibrillation.  EXAM: CT HEAD WITHOUT CONTRAST  TECHNIQUE: Contiguous axial images were obtained from the base of the skull through the vertex without intravenous contrast.  COMPARISON:  MRI of the brain February 08, 2014  FINDINGS: The ventricles and sulci are normal for age. No intraparenchymal hemorrhage, mass effect nor midline shift. Patchy supratentorial white matter hypodensities are less than expected for patient's age and though non-specific suggest sequelae of chronic small vessel ischemic disease. No acute large vascular territory infarcts. Small posterior fossa arachnoid cyst without mass effect.  No abnormal extra-axial fluid collections. Basal cisterns are patent. Moderate calcific atherosclerosis of the carotid siphons.  No skull fracture. The included ocular globes and orbital contents are non-suspicious. The mastoid aircells and included paranasal sinuses are well-aerated.  IMPRESSION: No acute intracranial process.  Involutional changes. Mild white matter changes suggest chronic small vessel ischemic disease, less than expected for age.    Electronically Signed   By: Elon Alas M.D.   On: 11/10/2014 01:09    EKG: NSR with sinus arrhythmia, 75 bpm, nonspecific lateral st/t changes   Weights: Filed Weights   11/10/14 0003 11/10/14 0407  Weight: 129 lb 12.8 oz (58.877 kg) 124 lb 1.6 oz (56.291 kg)     Physical Exam: Blood pressure 124/85, pulse 79, temperature 97.6 F (36.4 C), temperature source Axillary, resp. rate 19, height 5\' 10"  (1.778 m), weight 124 lb 1.6 oz (56.291 kg), SpO2 100 %. Body mass index is 17.81 kg/(m^2). General: Well developed, well nourished, in no acute distress. Frail appearing.  Head: Normocephalic, atraumatic, sclera non-icteric, no xanthomas, nares are without discharge.  Neck: Negative for carotid bruits. JVD not elevated. Lungs: Clear bilaterally to auscultation without wheezes, rales, or rhonchi. Breathing is unlabored. Heart: RRR with S1 S2. No murmurs, rubs, or gallops appreciated. Abdomen: Soft, non-tender, non-distended with normoactive bowel sounds. No hepatomegaly. No rebound/guarding. No obvious abdominal masses. Msk:  Strength and tone appear weak for age. Extremities: No clubbing or cyanosis. No edema.  Distal pedal pulses are 2+ and equal bilaterally. Neuro: Alert and oriented X 3. No facial asymmetry. No focal deficit. Moves all extremities spontaneously. Psych:  Responds to questions appropriately with a normal affect.    Assessment and Plan:  81 y.o. male with h/o PAF on warfarin, Parkinson's disease, basal cell, malignant melanoma of the skin, glaucoma, and arthritis who presented to [facility] on 6/15 after suffering a fall and 2 possible syncopal episodes. He was found to have a mildly elevated troponin of 0.06.   1. Syncope: -Possibly 2/2 orthostatic hypotension as readings here indicate he is orthostatic  -Systolic BP has been soft as well in the 80's as well causing him to fell somewhat weak and limiting his ability to work with PT -Check echo to evaluate LV function and  wall motion (prior echo with normal LV function) -Add low dose florinef 0.1 mg daily   2. Elevated troponin: -Likely supply demand ischemia in the setting of hypotension  -Check echo as above -Would limit ischemia evaluations at this time unless further indications   3. PAF: -Currently in NSR, not on rate controlling medications  -On warfarin  -CHADSVASc at least 2 (age x 2) giving him an estimated annual stroke risk of 2.2% -CT head without acute bleed -Could continue warfarin at this time with the addition of the above Florinef, should he continue to have falls would then need to discontinue  warfarin   4. Parkinson's disease: -Continue current medications  5. Glaucoma: -Continue drops    Melvern Banker, PA-C Pager: [phone] 11/10/2014, 8:37 AM

## 2014-11-10 NOTE — H&P (Signed)
[facility] [place] NAME: Randy Mcknight    MR#:  [id number]  DATE OF BIRTH:  [birth year]   DATE OF ADMISSION:  11/09/2014  PRIMARY CARE PHYSICIAN: Lelon Huh, MD   REQUESTING/REFERRING PHYSICIAN: Marjean Donna  CHIEF COMPLAINT:   Chief Complaint  Patient presents with  . Loss of Consciousness    HISTORY OF PRESENT ILLNESS:  Randy Mcknight  is a 81 y.o. male with a known history of atrial fibrillation on warfarin for anticoagulation, Parkinson's presenting after syncopal episode. He was attempting to walk from the bedroom to the bathroom, he felt generalized weakness subsequently felt that he was going to pass out. However, he was able to lower himself to the ground without sustaining any injuries. Given his general mobility issues he had a difficult time getting off of the floor apparently this took 20-30 minutes. When he was finally seated his family member noted that he appeared to be unresponsive for a brief period of time, this is when they called EMS. On EMS arrival when attempting to move to another chair had another episode where he went unresponsive. Both of these episodes were brief, no head trauma, no postictal state, witnessed seizure activity, loss of bowel/bladder function. He attests to preceding generalized weakness before each episode.  PAST MEDICAL HISTORY:   Past Medical History  Diagnosis Date  . Arthritis   . Hernia   . Atrial fibrillation   . Parkinson disease   . Glaucoma 2005  . Cancer     Basal cell melanoma  . Malignant melanoma of skin of upper limb, including shoulder December 29, 2007    wide excision Clark level IV,, 3 mm deep malignant melanoma,, negative margins, negative sentinel nodes.pT3a,N0    PAST SURGICAL HISTORY:   Past Surgical History  Procedure Laterality Date  . Lymph node dissection  2009  . Eye surgery Bilateral   . Melanoma excision  2009  . Replacement total knee  Right 2005  . Mass excision Right 01-21-14    right upper arm    SOCIAL HISTORY:   History  Substance Use Topics  . Smoking status: Never Smoker   . Smokeless tobacco: Never Used  . Alcohol Use: No    FAMILY HISTORY:   Family History  Problem Relation Age of Onset  . Stroke Mother     DRUG ALLERGIES:   Allergies  Allergen Reactions  . Latex   . Ivp Dye [Iodinated Diagnostic Agents] Rash    REVIEW OF SYSTEMS:  REVIEW OF SYSTEMS:  CONSTITUTIONAL: Denies fevers, chills, fatigue, positive weakness.  EYES: Denies blurred vision, double vision, or eye pain.  EARS, NOSE, THROAT: Denies tinnitus, ear pain, hearing loss.  RESPIRATORY: denies cough, shortness of breath, wheezing  CARDIOVASCULAR: Denies chest pain, palpitations, edema.  GASTROINTESTINAL: Denies nausea, vomiting, diarrhea, abdominal pain.  GENITOURINARY: Denies dysuria, hematuria.  ENDOCRINE: Denies nocturia or thyroid problems. HEMATOLOGIC AND LYMPHATIC: Denies easy bruising or bleeding.  SKIN: Denies rash or lesions.  MUSCULOSKELETAL: Denies pain in neck, back, shoulder, knees, hips, or further arthritic symptoms.  NEUROLOGIC: Denies paralysis, paresthesias.  PSYCHIATRIC: Denies anxiety or depressive symptoms. Otherwise full review of systems performed by me is negative.   MEDICATIONS AT HOME:   Prior to Admission medications   Medication Sig Start Date End Date Taking? Authorizing Provider  brimonidine (ALPHAGAN P) 0.1 % SOLN Place 1 drop into both eyes 2 (two) times daily.   Yes Historical Provider, MD  carbidopa-levodopa (SINEMET  IR) 25-100 MG per tablet Take 2 tablets by mouth 3 (three) times daily.  12/30/12  Yes Historical Provider, MD  COUMADIN 3 MG tablet  12/28/13  Yes Historical Provider, MD  donepezil (ARICEPT) 10 MG tablet Take 10 mg by mouth at bedtime.   Yes Historical Provider, MD  dorzolamide (TRUSOPT) 2 % ophthalmic solution 1 drop 2 (two) times daily.   Yes Historical Provider, MD   latanoprost (XALATAN) 0.005 % ophthalmic solution Place 1 drop into both eyes at bedtime.   Yes Historical Provider, MD  LOVENOX 30 MG/0.3ML injection  01/28/14  Yes Historical Provider, MD      VITAL SIGNS:  Blood pressure 113/71, pulse 85, temperature 97.6 F (36.4 C), temperature source Axillary, resp. rate 18, height 5\' 10"  (1.778 m), weight 129 lb 12.8 oz (58.877 kg), SpO2 94 %.  PHYSICAL EXAMINATION:  VITAL SIGNS: Filed Vitals:   11/10/14 0042  BP:   Pulse:   Temp: 97.6 F (36.4 C)  Resp:    GENERAL:81 y.o.male currently in no acute distress.  HEAD: Normocephalic, atraumatic.  EYES: Pupils equal, round, reactive to light. Extraocular muscles intact. No scleral icterus.  MOUTH: Dry mucosal membrane. Dentition poor. No abscess noted.  EAR, NOSE, THROAT: Clear without exudates. No external lesions.  NECK: Supple. No thyromegaly. No nodules. No JVD.  PULMONARY: Clear to ascultation, without wheeze rails or rhonci. No use of accessory muscles, Good respiratory effort. good air entry bilaterally CHEST: Nontender to palpation.  CARDIOVASCULAR: S1 and S2. Irregular rate and rhythm. No murmurs, rubs, or gallops. No edema. Pedal pulses 2+ bilaterally.  GASTROINTESTINAL: Soft, nontender, nondistended. No masses. Positive bowel sounds. No hepatosplenomegaly.  MUSCULOSKELETAL: No swelling, clubbing, or edema. Range of motion full in all extremities.  NEUROLOGIC: Cranial nerves II through XII are intact. No gross focal neurological deficits. Sensation intact. Reflexes intact.  SKIN: No ulceration, lesions, rashes, or cyanosis. Skin warm and dry. Turgor intact.  PSYCHIATRIC: Mood, affect within normal limits. The patient is awake, alert and oriented x 3. Insight, judgment intact.    LABORATORY PANEL:   CBC  Recent Labs Lab 11/10/14 0010  WBC 10.7*  HGB 12.0*  HCT 37.5*  PLT 215    ------------------------------------------------------------------------------------------------------------------  Chemistries   Recent Labs Lab 11/10/14 0010  NA 138  K 3.8  CL 108  CO2 23  GLUCOSE 176*  BUN 18  CREATININE 1.06  CALCIUM 8.1*  AST 20  ALT <5*  ALKPHOS 82  BILITOT 0.7   ------------------------------------------------------------------------------------------------------------------  Cardiac Enzymes  Recent Labs Lab 11/10/14 0010  TROPONINI <0.03   ------------------------------------------------------------------------------------------------------------------  RADIOLOGY:  Ct Head Wo Contrast  11/10/2014   CLINICAL DATA:  Syncopal episode, found down by wife on floor. Fall versus slip from toilet. History of Parkinson's disease, cancer, atrial fibrillation.  EXAM: CT HEAD WITHOUT CONTRAST  TECHNIQUE: Contiguous axial images were obtained from the base of the skull through the vertex without intravenous contrast.  COMPARISON:  MRI of the brain February 08, 2014  FINDINGS: The ventricles and sulci are normal for age. No intraparenchymal hemorrhage, mass effect nor midline shift. Patchy supratentorial white matter hypodensities are less than expected for patient's age and though non-specific suggest sequelae of chronic small vessel ischemic disease. No acute large vascular territory infarcts. Small posterior fossa arachnoid cyst without mass effect.  No abnormal extra-axial fluid collections. Basal cisterns are patent. Moderate calcific atherosclerosis of the carotid siphons.  No skull fracture. The included ocular globes and orbital contents are non-suspicious. The mastoid aircells  and included paranasal sinuses are well-aerated.  IMPRESSION: No acute intracranial process.  Involutional changes. Mild white matter changes suggest chronic small vessel ischemic disease, less than expected for age.   Electronically Signed   By: Elon Alas M.D.   On: 11/10/2014  01:09    EKG:   Orders placed or performed during the hospital encounter of 11/09/14  . ED EKG  . ED EKG    IMPRESSION AND PLAN:   81 year old Caucasian gentleman history of chronic atrial fibrillation on warfarin presenting with syncopal episode.  1. Syncope, unspecified: Place under observational status, telemetry, trend cardiac enzymes, IV fluid hydration, check orthostatic vital signs 2. Chronic atrial fibrillation: Continue Coumadin Parkinson's continue his Sinemet Venous thromboembolism prophylactic therapeutic warfarin     All the records are reviewed and case discussed with ED provider. Management plans discussed with the patient, family and they are in agreement.  CODE STATUS: Full  TOTAL TIME TAKING CARE OF THIS PATIENT: 35 minutes.    Brandi Tomlinson,  Karenann Cai.D on 11/10/2014 at 2:46 AM  Between 7am to 6pm - Pager - [phone]  After 6pm: House Pager: - [phone]  [facility]  Office  [phone]  CC: Primary care physician; Lelon Huh, MD

## 2014-11-10 NOTE — Progress Notes (Signed)
*  PRELIMINARY RESULTS* Echocardiogram 2D Echocardiogram has been performed.  Randy Mcknight 11/10/2014, 1:02 PM

## 2014-11-10 NOTE — Care Management (Signed)
Physical therapy is recommending skilled nursing placement.  Notified attending and anticipate transfer 6/17 as now must send out FL2- bed offers, etc.  Wife is in agreement

## 2014-11-10 NOTE — Telephone Encounter (Signed)
Left message on machine for patient to contact the office if any questions regarding appt., medications, discharge instructions and to bring meds or med list to appt. Confirmed time and date of appt. 8/1 at 3pm, but will call if we can get him in sooner.    Patient contacted regarding discharge from [facility] Patient understands to follow up with provider Dr. Rockey Situ on 8/1 at 3pm at [facility] [place]. Patient understands discharge instructions?  Patient understands medications and regiment?  Patient understands to bring all medications to this visit?

## 2014-11-10 NOTE — Evaluation (Signed)
Physical Therapy Evaluation Patient Details Name: Randy Mcknight MRN: [id number] DOB: 09-Aug-1930 Today's Date: 11/10/2014   History of Present Illness  presented to ER status post fall with 2-3 subsequent unresponsive episodes; admitted under observation for syncope.  Mild elevation noted in troponin, consistent with demand ischemia per cardiology.  Clinical Impression  Upon evaluation, patient lethargic, but arousable for participation with session.  Demonstrates strength and ROM grossly WFL for basic transfers and mobility (R shoulder elevation limited to shoulder height; R wrist/finger extension minimal).  Maintains very forward flexed posture in all unsupported sitting and standing postures; very minimal lumbar extension.  Currently requiring mod assist +2 for sit/stand, basic transfers and gait with Rw. Very poor standing balance; very high fall risk. Would benefit from skilled PT to address above deficits and promote optimal return to PLOF; recommend transition to STR upon discharge from acute hospitalization. Patient/family aware of recommendations and in agreement with plan.     Follow Up Recommendations SNF    Equipment Recommendations       Recommendations for Other Services       Precautions / Restrictions Precautions Precautions: Fall Restrictions Weight Bearing Restrictions: No      Mobility  Bed Mobility Overal bed mobility: Needs Assistance Bed Mobility: Supine to Sit     Supine to sit: Min assist        Transfers Overall transfer level: Needs assistance Equipment used: Rolling walker (2 wheeled) Transfers: Sit to/from Stand Sit to Stand: Mod assist;+2 physical assistance         General transfer comment: constant cuing for hand placement, assist for anterior weight translation and lift off  Ambulation/Gait Ambulation/Gait assistance: Mod assist;+2 physical assistance Ambulation Distance (Feet): 15 Feet Assistive device: Rolling walker (2  wheeled)       General Gait Details: step to gait pattern (questionable R LE LLD); very forward flexed posture with downward gaze.  Excessive R ankle supination with forefoot varus throughout gait cycle.  Unable tot olerate additional distance due to fatigue.  Stairs            Wheelchair Mobility    Modified Rankin (Stroke Patients Only)       Balance Overall balance assessment: Needs assistance Sitting-balance support: No upper extremity supported;Feet supported Sitting balance-Leahy Scale: Fair     Standing balance support: Bilateral upper extremity supported Standing balance-Leahy Scale: Poor                               Pertinent Vitals/Pain Pain Assessment: No/denies pain    Home Living Family/patient expects to be discharged to:: Private residence Living Arrangements: Spouse/significant other Available Help at Discharge: Family Type of Home: House Home Access: Stairs to enter   Technical brewer of Steps: 3 Home Layout: One level Home Equipment: Bedside commode;Cane - single point;Walker - 4 wheels (lift chair)      Prior Function Level of Independence: Needs assistance   Gait / Transfers Assistance Needed: Min assist for wife and use of 4WRW for all household mobility/activities; receiving 'maintenance' PT once/week           Hand Dominance   Dominant Hand: Right    Extremity/Trunk Assessment   Upper Extremity Assessment: Generalized weakness (R shoulder elevation limited to shoulder height only, lacking R wrist extension and finger extension (previous injury).  L UE grossly WFL)           Lower Extremity Assessment: Generalized weakness (generally at  least 3-/5; lacking 15-20 degrees of bilat knee extension due to fatigue)         Communication      Cognition Arousal/Alertness: Awake/alert Behavior During Therapy: WFL for tasks assessed/performed Overall Cognitive Status: History of cognitive impairments - at  baseline                      General Comments      Exercises        Assessment/Plan    PT Assessment Patient needs continued PT services  PT Diagnosis Difficulty walking;Generalized weakness   PT Problem List Decreased strength;Decreased range of motion;Decreased activity tolerance;Decreased balance;Decreased mobility;Decreased coordination;Decreased cognition;Decreased knowledge of use of DME;Decreased safety awareness;Decreased knowledge of precautions  PT Treatment Interventions DME instruction;Gait training;Stair training;Functional mobility training;Therapeutic activities;Therapeutic exercise;Patient/family education;Balance training   PT Goals (Current goals can be found in the Care Plan section) Acute Rehab PT Goals Patient Stated Goal: "to get stronger" PT Goal Formulation: With patient Time For Goal Achievement: 11/24/14 Potential to Achieve Goals: Good    Frequency Min 2X/week   Barriers to discharge Inaccessible home environment      Co-evaluation               End of Session Equipment Utilized During Treatment: Gait belt Activity Tolerance: Patient limited by fatigue Patient left: in chair;with call bell/phone within reach;with chair alarm set Nurse Communication: Mobility status (discharge recommendations)    Functional Assessment Tool Used: clinical judgement Functional Limitation: Mobility: Walking and moving around Mobility: Walking and Moving Around Current Status [phone]): At least 60 percent but less than 80 percent impaired, limited or restricted Mobility: Walking and Moving Around Goal Status [phone]): At least 20 percent but less than 40 percent impaired, limited or restricted    Time: 1421-1457 PT Time Calculation (min) (ACUTE ONLY): 36 min   Charges:   PT Evaluation $Initial PT Evaluation Tier I: 1 Procedure     PT G Codes:   PT G-Codes **NOT FOR INPATIENT CLASS** Functional Assessment Tool Used: clinical judgement Functional  Limitation: Mobility: Walking and moving around Mobility: Walking and Moving Around Current Status (J8250): At least 60 percent but less than 80 percent impaired, limited or restricted Mobility: Walking and Moving Around Goal Status [phone]): At least 20 percent but less than 40 percent impaired, limited or restricted    Jamaya Sleeth H. Owens Shark, PT, DPT 11/10/2014, 5:09 PM [phone]

## 2014-11-10 NOTE — Progress Notes (Signed)
Pt alert and verbally responsive, came from home with wife , admitted with syncopal episode. S/p fall with no injury. Pt c/o no pain or discomfort, no visible sign of distress noted.Wife at bedside, iv fluids infusing , High fall risk, voiding in urinal without difficulty.Call bell within reach.

## 2014-11-10 NOTE — Care Management (Signed)
Patient presents form home.  His wife is his primary caregiver.  Currently he is having a greater than 20 point drop in his systolic blood pressure from lying to sitting.  Patient's wife states that patient over the last week has become increasingly weak.  He is admitted for syncopal episode in which he "lost consciousness."  Patient has long standing history of parkinsons.  He requires assist with all adls.  He is not able to use his right arm due to surgery for recurrent melanoma and a "nerve was cut" when the lesion was excised.  He is currently followed by [facility] home health physical therapy.  If patient is able to discharge directly home, can add SN.  Awaiting physical therapy consult

## 2014-11-10 NOTE — Clinical Social Work Note (Signed)
Clinical Social Work Assessment  Patient Details  Name: Randy Mcknight MRN: [id number] Date of Birth: 1930-12-18  Date of referral:  11/10/14               Reason for consult:  Facility Placement                Permission sought to share information with:  Family Supports, Chartered certified accountant granted to share information::  Yes, Verbal Permission Granted  Name::        Agency::     Relationship::     Contact Information:     Housing/Transportation Living arrangements for the past 2 months:  Single Family Home Source of Information:  Patient, Medical Team, Spouse Patient Interpreter Needed:  None Criminal Activity/Legal Involvement Pertinent to Current Situation/Hospitalization:  No - Comment as needed Significant Relationships:  Spouse Lives with:  Spouse Do you feel safe going back to the place where you live?  Yes Need for family participation in patient care:  Yes (Comment)  Care giving concerns:  Pt's wife was concerned with being able to take care of pt at home.     Social Worker assessment / plan:  CSW spoke to pt's wife.  Pt was only oriented to self at time of assessment.  Per pt's wife, pt and wife live in home together.  She is in agreement with placement at SNF.  Previous experience with [facility] and [facility].  CSW will f/u once offers have been received.  Employment status:  Disabled (Comment on whether or not currently receiving Disability), Retired Forensic scientist:  Medicare PT Recommendations:  [place] / Referral to community resources:     Patient/Family's Response to care:  Pt's wife was in agreement with SNF placement  Patient/Family's Understanding of and Emotional Response to Diagnosis, Current Treatment, and Prognosis:  Pt's wife stated that she understood the recommendation for SNF.    Emotional Assessment Appearance:  Appears stated age Attitude/Demeanor/Rapport:   (pt was confused  during assessment.  ) Affect (typically observed):   (unable to assess) Orientation:  Oriented to Self Alcohol / Substance use:  Never Used Psych involvement (Current and /or in the community):  No (Comment)  Discharge Needs  Concerns to be addressed:  Care Coordination Readmission within the last 30 days:  No Current discharge risk:  Physical Impairment Barriers to Discharge:      Mathews Argyle, LCSW 11/10/2014, 4:12 PM

## 2014-11-10 NOTE — Progress Notes (Signed)
Md notified regarding patient's Troponin level=0.06, See Mar for new orders.

## 2014-11-11 ENCOUNTER — Encounter
Admission: RE | Admit: 2014-11-11 | Discharge: 2014-11-11 | Disposition: A | Discharge status: Home / Self Care | Payer: Medicare Other | Source: Ambulatory Visit | Attending: Internal Medicine | Admitting: Internal Medicine

## 2014-11-11 ENCOUNTER — Telehealth: Payer: Self-pay | Admitting: Family Medicine

## 2014-11-11 DIAGNOSIS — I4891 Unspecified atrial fibrillation: Secondary | ICD-10-CM | POA: Insufficient documentation

## 2014-11-11 NOTE — Discharge Instructions (Signed)
°  DIET:  °Cardiac diet ° °DISCHARGE CONDITION:  °Stable ° °ACTIVITY:  °Activity as tolerated ° °OXYGEN:  °Home Oxygen: No. °  °Oxygen Delivery: room air ° °DISCHARGE LOCATION:  °nursing home  ° °If you experience worsening of your admission symptoms, develop shortness of breath, life threatening emergency, suicidal or homicidal thoughts you must seek medical attention immediately by calling 911 or calling your MD immediately  if symptoms less severe. ° °You Must read complete instructions/literature along with all the possible adverse reactions/side effects for all the Medicines you take and that have been prescribed to you. Take any new Medicines after you have completely understood and accpet all the possible adverse reactions/side effects.  ° °Please note ° °You were cared for by a hospitalist during your hospital stay. If you have any questions about your discharge medications or the care you received while you were in the hospital after you are discharged, you can call the unit and asked to speak with the hospitalist on call if the hospitalist that took care of you is not available. Once you are discharged, your primary care physician will handle any further medical issues. Please note that NO REFILLS for any discharge medications will be authorized once you are discharged, as it is imperative that you return to your primary care physician (or establish a relationship with a primary care physician if you do not have one) for your aftercare needs so that they can reassess your need for medications and monitor your lab values. ° ° ° °

## 2014-11-11 NOTE — Progress Notes (Signed)
Physical Therapy Treatment Patient Details Name: Randy Mcknight MRN: [id number] DOB: 1930/09/11 Today's Date: 11/11/2014    History of Present Illness presented to ER status post fall with 2-3 subsequent unresponsive episodes; admitted under observation for syncope.  Mild elevation noted in troponin, consistent with demand ischemia per cardiology.    PT Comments    Patient with noted improvement in alertness and level of participation this date.  Able to complete transfers and gait (of increased distance) with less physical assist than previous session.  Continues to require constant assist from +1 (second person for chair follow for safety) due to persistent strength/balance deficits, but anticipate continued progress towards goals as medical status improves.  Follow Up Recommendations  SNF     Equipment Recommendations       Recommendations for Other Services       Precautions / Restrictions Precautions Precautions: Fall Restrictions Weight Bearing Restrictions: No    Mobility  Bed Mobility Overal bed mobility: Needs Assistance Bed Mobility: Supine to Sit     Supine to sit: Min assist     General bed mobility comments: for truncal elevation and scooting towards edge of bed (prefers to use UEs to pull self forward vs. lift/scoot)  Transfers Overall transfer level: Needs assistance Equipment used: Rolling walker (2 wheeled) Transfers: Sit to/from Stand Sit to Stand: Mod assist         General transfer comment: improved to +1 assist this date; improved task initiation, decreased level of assist required  Ambulation/Gait Ambulation/Gait assistance: Mod assist;+2 safety/equipment Ambulation Distance (Feet): 50 Feet Assistive device: Rolling walker (2 wheeled)       General Gait Details: step to gait pattern (questionable R LE LLD); forward flexed, but improved from previous session.  Excessive R ankle supination with forefoot varus throughout gait cycle.   Improved cadence, gait speed and gait tolerance.  No overt buckling, improved knee control.   Stairs            Wheelchair Mobility    Modified Rankin (Stroke Patients Only)       Balance                                    Cognition                            Exercises Other Exercises Other Exercises: Additional gait trial x40' with RW, mod assist +1 (second person chair follow for safety)--deviations as noted above. Other Exercises: Unsupported sitting with bilat UEs propped on elevated table anterior to patient, performed L UE dynamic reaching and cervical extension tasks to promote cervical, thoracic and lumbar extension with all functional activities.    General Comments        Pertinent Vitals/Pain Pain Assessment: No/denies pain    Home Living                      Prior Function            PT Goals (current goals can now be found in the care plan section) Acute Rehab PT Goals Patient Stated Goal: "to get stronger" PT Goal Formulation: With patient Time For Goal Achievement: 11/24/14 Potential to Achieve Goals: Good Progress towards PT goals: Progressing toward goals    Frequency  Min 2X/week    PT Plan Current plan remains appropriate    Co-evaluation  End of Session Equipment Utilized During Treatment: Gait belt Activity Tolerance: Patient limited by fatigue Patient left: in chair;with call bell/phone within reach;with chair alarm set     Time: 2263-3354 PT Time Calculation (min) (ACUTE ONLY): 26 min  Charges:  $Gait Training: 8-22 mins $Therapeutic Activity: 8-22 mins                    G Codes:      Zipporah Finamore H. Owens Shark, PT, DPT, NCS 11/11/2014, 11:44 AM [phone]

## 2014-11-11 NOTE — Clinical Social Work Placement (Signed)
   CLINICAL SOCIAL WORK PLACEMENT  NOTE  Date:  11/11/2014  Patient Details  Name: Randy Mcknight MRN: [id number] Date of Birth: 1930-06-23  Clinical Social Work is seeking post-discharge placement for this patient at the [place] level of care (*CSW will initial, date and re-position this form in  chart as items are completed):  Yes   Patient/family provided with [facility] Work Department's list of facilities offering this level of care within the geographic area requested by the patient (or if unable, by the patient's family).  Yes   Patient/family informed of their freedom to choose among providers that offer the needed level of care, that participate in Medicare, Medicaid or managed care program needed by the patient, have an available bed and are willing to accept the patient.  Yes   Patient/family informed of [facility]'s ownership interest in [facility], as well as of the fact that they are under no obligation to receive care at these facilities.  PASRR submitted to EDS on 11/11/14     PASRR number received on 11/11/14     Existing PASRR number confirmed on       FL2 transmitted to all facilities in geographic area requested by pt/family on 11/11/14     FL2 transmitted to all facilities within larger geographic area on       Patient informed that his/her managed care company has contracts with or will negotiate with certain facilities, including the following:   (Yes this was explaiined to pt's wife )     Yes   Patient/family informed of bed offers received.  Patient chooses bed at  [facility])     Physician recommends and patient chooses bed at      Patient to be transferred to  [facility]) on 11/11/14.  Patient to be transferred to facility by  [facility])     Patient family notified on 11/11/14 of transfer.  Name of family member notified:   (wife)     PHYSICIAN       Additional  Comment:    _______________________________________________ Mathews Argyle, LCSW 11/11/2014, 2:40 PM

## 2014-11-11 NOTE — Progress Notes (Signed)
Checked to see if pre-authorization would be needed for non-emergent EMS transport.  Per [facility] automated system, [phone], patient has a [employer] Medicare Advantage PPO policy.  Medicare PPO plans do not require pre-auth for non-emergent ground transports using service codes [phone] or [phone].

## 2014-11-11 NOTE — Discharge Summary (Signed)
[facility] [place] NAME: Randy Mcknight    MR#:  [id number]  DATE OF BIRTH:  1930/07/10  DATE OF ADMISSION:  11/09/2014 ADMITTING PHYSICIAN: Lytle Butte, MD  DATE OF DISCHARGE: 11/11/2014  PRIMARY CARE PHYSICIAN: Lelon Huh, MD    ADMISSION DIAGNOSIS:  Syncope, unspecified syncope type [R55]  DISCHARGE DIAGNOSIS:  Principal Problem:   Syncope Active Problems:   Orthostatic hypotension   Elevated troponin   SECONDARY DIAGNOSIS:   Past Medical History  Diagnosis Date  . Arthritis   . Hernia   . Atrial fibrillation   . Parkinson disease   . Glaucoma 2005  . Cancer     Basal cell melanoma  . Malignant melanoma of skin of upper limb, including shoulder December 29, 2007    wide excision Clark level IV,, 3 mm deep malignant melanoma,, negative margins, negative sentinel nodes.pT3a,N0    HOSPITAL COURSE:   81 year old male with past medical history of Parkinson's disease, chronic atrial fibrillation, osteoarthritis, glaucoma, skin cancer, who presented to the hospital due to a syncopal episode and noted to have a mildly elevated troponin.  #1 syncope-this was likely secondary to orthostasis. There was no evidence of any acute neurogenic/cardiogenic syncope. -Patient was observed on telemetry and did not have any acute arrhythmia other than his chronic A. fib. -Patient has CT head done on admission which was negative for any acute pathology. -Echocardiogram showed normal ejection fraction with no acute structural heart disease. Patient was started on Florinef and his orthostasis has improved and is being discharged in a presently.  #2 elevated troponin-this was likely secondary to demand ischemia without any evidence of acute coronary syndrome. Patient was seen by cardiology who agreed with this. -Echocardiogram showed normal ejection fraction with no wall motion abnormalities.  #3 history of chronic  afibrillation-patient remained rate controlled. Patient is not on any rate controlling meds given his orthostasis presently. -He is on Coumadin and his INR is therapeutic.  #4 glaucoma-patient was maintained on his latanoprost, dorzolamide eyedrops and he will resume that.  #5 Parkinson's disease-patient was maintained on a Sinemet and Aricept and he will resume that. -Patient is followed by Dr. Melrose Nakayama as outpatient.  Patient was seen by physical therapy and thought he would benefit from short-term rehabilitation which is where he is presently being discharged and the patient's wife is in agreement.    DISCHARGE CONDITIONS:   Stable  CONSULTS OBTAINED:  Treatment Team:  Lytle Butte, MD Wellington Hampshire, MD  DRUG ALLERGIES:   Allergies  Allergen Reactions  . Latex   . Ivp Dye [Iodinated Diagnostic Agents] Rash    DISCHARGE MEDICATIONS:   Current Discharge Medication List    START taking these medications   Details  fludrocortisone (FLORINEF) 0.1 MG tablet Take 1 tablet (0.1 mg total) by mouth daily. Qty: 30 tablet, Refills: 0      CONTINUE these medications which have NOT CHANGED   Details  brimonidine (ALPHAGAN P) 0.1 % SOLN Place 1 drop into both eyes 2 (two) times daily.    carbidopa-levodopa (SINEMET IR) 25-100 MG per tablet Take 2 tablets by mouth 3 (three) times daily.     COUMADIN 3 MG tablet Take 3-4 mg by mouth daily. Pt takes 3 mg Sun, Tues, Thurs, Sat and 4 mg on Mon, Wed, Fri    donepezil (ARICEPT) 10 MG tablet Take 10 mg by mouth at bedtime.    dorzolamide (TRUSOPT) 2 % ophthalmic solution  1 drop 2 (two) times daily.    latanoprost (XALATAN) 0.005 % ophthalmic solution Place 1 drop into both eyes at bedtime.      STOP taking these medications     LOVENOX 30 MG/0.3ML injection          DISCHARGE INSTRUCTIONS:   DIET:  Cardiac diet  DISCHARGE CONDITION:  Stable  ACTIVITY:  Activity as tolerated  OXYGEN:  Home Oxygen: No.   Oxygen  Delivery: room air  DISCHARGE LOCATION:  nursing home   If you experience worsening of your admission symptoms, develop shortness of breath, life threatening emergency, suicidal or homicidal thoughts you must seek medical attention immediately by calling 911 or calling your MD immediately  if symptoms less severe.  You Must read complete instructions/literature along with all the possible adverse reactions/side effects for all the Medicines you take and that have been prescribed to you. Take any new Medicines after you have completely understood and accpet all the possible adverse reactions/side effects.   Please note  You were cared for by a hospitalist during your hospital stay. If you have any questions about your discharge medications or the care you received while you were in the hospital after you are discharged, you can call the unit and asked to speak with the hospitalist on call if the hospitalist that took care of you is not available. Once you are discharged, your primary care physician will handle any further medical issues. Please note that NO REFILLS for any discharge medications will be authorized once you are discharged, as it is imperative that you return to your primary care physician (or establish a relationship with a primary care physician if you do not have one) for your aftercare needs so that they can reassess your need for medications and monitor your lab values.     Today    VITAL SIGNS:  Blood pressure 132/88, pulse 81, temperature 98.4 F (36.9 C), temperature source Oral, resp. rate 20, height 5\' 10"  (1.778 m), weight 56.291 kg (124 lb 1.6 oz), SpO2 100 %.  I/O:   Intake/Output Summary (Last 24 hours) at 11/11/14 0925 Last data filed at 11/11/14 0918  Gross per 24 hour  Intake 1398.75 ml  Output    350 ml  Net 1048.75 ml    PHYSICAL EXAMINATION:  GENERAL:  81 y.o.-year-old patient lying in the bed with no acute distress.  EYES: Pupils equal, round,  reactive to light and accommodation. No scleral icterus. Extraocular muscles intact.  HEENT: Head atraumatic, normocephalic. Oropharynx and nasopharynx clear.  NECK:  Supple, no jugular venous distention. No thyroid enlargement, no tenderness.  LUNGS: Normal breath sounds bilaterally, no wheezing, rales,rhonchi. No use of accessory muscles of respiration.  CARDIOVASCULAR: S1, S2 normal.  II/VI SEM at RSB, no rubs, gallops, clicks.  ABDOMEN: Soft, non-tender, non-distended. Bowel sounds present. No organomegaly or mass.  EXTREMITIES: No pedal edema, cyanosis, or clubbing.  NEUROLOGIC: Cranial nerves II through XII are intact. No focal motor or sensory defecits b/l. + Parkinsonian Tremor.  PSYCHIATRIC: The patient is alert and oriented x 3. Good affect.  SKIN: No obvious rash, lesion, or ulcer.   DATA REVIEW:   CBC  Recent Labs Lab 11/10/14 0010  WBC 10.7*  HGB 12.0*  HCT 37.5*  PLT 215    Chemistries   Recent Labs Lab 11/10/14 0010  NA 138  K 3.8  CL 108  CO2 23  GLUCOSE 176*  BUN 18  CREATININE 1.06  CALCIUM 8.1*  AST  20  ALT <5*  ALKPHOS 82  BILITOT 0.7    Cardiac Enzymes  Recent Labs Lab 11/10/14 1634  TROPONINI 0.03    Microbiology Results  No results found for this or any previous visit.  RADIOLOGY:  Dg Chest 2 View  11/10/2014   CLINICAL DATA:  Weakness. Fatigue. Hypoxia. Atrial fibrillation. Parkinson's disease. Hospital admission yesterday.  EXAM: CHEST  2 VIEW  COMPARISON:  10/02/2011 PA and lateral chest radiograph.  FINDINGS: Pectus excavatum deformity is present which produces the abnormal appearance of the RIGHT hilar vasculature. This is a chronic finding. There is no airspace disease or effusion. Monitoring leads project over the chest. Cardiopericardial silhouette is within normal limits.  Severe bilateral glenohumeral osteoarthritis. Flattening of the RIGHT humeral head compatible with old AVN.  IMPRESSION: No active cardiopulmonary disease or  interval change. Pectus excavatum.   Electronically Signed   By: Dereck Ligas M.D.   On: 11/10/2014 16:26   Ct Head Wo Contrast  11/10/2014   CLINICAL DATA:  Syncopal episode, found down by wife on floor. Fall versus slip from toilet. History of Parkinson's disease, cancer, atrial fibrillation.  EXAM: CT HEAD WITHOUT CONTRAST  TECHNIQUE: Contiguous axial images were obtained from the base of the skull through the vertex without intravenous contrast.  COMPARISON:  MRI of the brain February 08, 2014  FINDINGS: The ventricles and sulci are normal for age. No intraparenchymal hemorrhage, mass effect nor midline shift. Patchy supratentorial white matter hypodensities are less than expected for patient's age and though non-specific suggest sequelae of chronic small vessel ischemic disease. No acute large vascular territory infarcts. Small posterior fossa arachnoid cyst without mass effect.  No abnormal extra-axial fluid collections. Basal cisterns are patent. Moderate calcific atherosclerosis of the carotid siphons.  No skull fracture. The included ocular globes and orbital contents are non-suspicious. The mastoid aircells and included paranasal sinuses are well-aerated.  IMPRESSION: No acute intracranial process.  Involutional changes. Mild white matter changes suggest chronic small vessel ischemic disease, less than expected for age.   Electronically Signed   By: Elon Alas M.D.   On: 11/10/2014 01:09      Management plans discussed with the patient, family and they are in agreement.  CODE STATUS:     Code Status Orders        Start     Ordered   11/10/14 0219  Full code   Continuous     11/10/14 0218    Advance Directive Documentation        Most Recent Value   Type of Advance Directive  Living will   Pre-existing out of facility DNR order (yellow form or pink MOST form)     "MOST" Form in Place?        TOTAL TIME TAKING CARE OF THIS PATIENT: 40 minutes.    Henreitta Leber M.D  on 11/11/2014 at 9:25 AM  Between 7am to 6pm - Pager - [phone]  After 6pm go to [email or web address] - password [email or web address] [facility]  Office  [phone]  CC: Primary care physician; Lelon Huh, MD

## 2014-11-11 NOTE — Progress Notes (Signed)
Report called to [facility]. Patients discharge instructions along with status gone over with nurse. Patient to be transported via EMS, wife wants to have ems called once she is her. Patient currently on RA, no distress noted. No c/o pain. Iv removed x2 and telemetry removed [employer]

## 2014-11-11 NOTE — Progress Notes (Signed)
Initial Nutrition Assessment   INTERVENTION: Meals and Snacks: Cater to patient preferences Medical Food Supplement Therapy: will recommend on follow if intake poor    NUTRITION DIAGNOSIS:  None at this time  GOAL:  Patient will meet greater than or equal to 90% of their needs  MONITOR:   (Energy Intake, Electrolyte and renal Profile, Digestive system, Anthropometrics)  REASON FOR ASSESSMENT:  Malnutrition Screening Tool    ASSESSMENT:  Pt admitted with syncope and weakness with a h/o afib and Parkinsons disease. Pt scheduled for discharge today to [facility]. PMHx: Past Medical History  Diagnosis Date  . Arthritis   . Hernia   . Atrial fibrillation   . Parkinson disease   . Glaucoma 2005  . Cancer     Basal cell melanoma  . Malignant melanoma of skin of upper limb, including shoulder December 29, 2007    wide excision Clark level IV,, 3 mm deep malignant melanoma,, negative margins, negative sentinel nodes.pT3a,N0    Current Nutrition: pt eating 70% of meals recorded since admission Food/Nutrition-Related History: per MST pt with decrease in appetite PTA  Medications: Labs:  Recent Labs Lab 11/10/14 0010  NA 138  K 3.8  CL 108  CO2 23  BUN 18  CREATININE 1.06  CALCIUM 8.1*  GLUCOSE 176*   Anthropometrics: per MST, pt unsure of weight changes PTA. Per [facility] pt weight 131-135lbs, current weight 131lbs. Height:  Ht Readings from Last 1 Encounters:  11/10/14 5\' 10"  (1.778 m)    Weight:  Wt Readings from Last 1 Encounters:  11/11/14 131 lb 3.2 oz (59.512 kg)    Ideal Body Weight:     Wt Readings from Last 10 Encounters:  11/11/14 131 lb 3.2 oz (59.512 kg)  04/13/14 135 lb (61.236 kg)  02/16/14 135 lb (61.236 kg)  02/02/14 133 lb (60.328 kg)  01/25/14 133 lb (60.328 kg)  01/11/14 131 lb (59.421 kg)  01/04/13 132 lb (59.875 kg)    BMI:  Body mass index is 18.83 kg/(m^2).  Diet Order:  Diet Heart Room service appropriate?: Yes; Fluid  consistency:: Thin Diet - low sodium heart healthy Diet - low sodium heart healthy  EDUCATION NEEDS:  No education needs identified at this time   Intake/Output Summary (Last 24 hours) at 11/11/14 1553 Last data filed at 11/11/14 1130  Gross per 24 hour  Intake   2275 ml  Output    150 ml  Net   2125 ml    Last BM:  6/17  LOW Care Level  Dwyane Luo, RD, LDN Pager [phone]

## 2014-11-11 NOTE — Progress Notes (Signed)
   11/11/14 1400  Clinical Encounter Type  Visited With Patient  Visit Type Initial  Spiritual Encounters  Spiritual Needs Prayer  Stress Factors  Patient Stress Factors Health changes  Family Stress Factors None identified   Faith tradition: Lutheran Status:  Family: wife is supportive he said but not here today Visit Assessment: Chaplain slightly readjusted the patient's blanket as per his request so that his feet would be warmer while he enjoyed his view from the window. He explained that there were 2 types of Lutherans and he was apart of the [place]. He also shared that he was a [employer]er for 30 years. He said that his wife was currently looking for a facility called [facility].   Chaplains and pastoral care can be reached via pager [phone] or by submitting an online order request daily, 24x7

## 2014-11-11 NOTE — Clinical Social Work Note (Signed)
CSW notified pt's wife, RN and facility, that pt would DC via EMS to [facility].  CSW signing off.

## 2014-11-11 NOTE — Telephone Encounter (Signed)
Pt is being discharged form [facility] today for Syncope/MJ

## 2014-11-15 DIAGNOSIS — I4891 Unspecified atrial fibrillation: Secondary | ICD-10-CM | POA: Diagnosis not present

## 2014-11-15 LAB — PROTIME-INR
INR: 2.11
Prothrombin Time: 23.8 seconds — ABNORMAL HIGH (ref 11.4–15.0)

## 2014-11-21 ENCOUNTER — Ambulatory Visit: Payer: Self-pay | Admitting: Internal Medicine

## 2014-11-22 DIAGNOSIS — I4891 Unspecified atrial fibrillation: Secondary | ICD-10-CM | POA: Diagnosis not present

## 2014-11-22 LAB — PROTIME-INR
INR: 2.53
PROTHROMBIN TIME: 27.4 s — AB (ref 11.4–15.0)

## 2014-11-23 ENCOUNTER — Ambulatory Visit: Payer: Self-pay

## 2014-11-23 ENCOUNTER — Inpatient Hospital Stay: Payer: Medicare Other | Admitting: Family Medicine

## 2014-11-24 ENCOUNTER — Ambulatory Visit: Payer: Self-pay | Admitting: Family Medicine

## 2014-11-25 ENCOUNTER — Encounter
Admission: RE | Admit: 2014-11-25 | Discharge: 2014-11-25 | Disposition: A | Discharge status: Home / Self Care | Payer: Medicare Other | Source: Ambulatory Visit | Attending: Internal Medicine | Admitting: Internal Medicine

## 2014-11-25 DIAGNOSIS — I4891 Unspecified atrial fibrillation: Secondary | ICD-10-CM | POA: Insufficient documentation

## 2014-11-29 ENCOUNTER — Telehealth: Payer: Self-pay | Admitting: Cardiovascular Disease

## 2014-11-29 DIAGNOSIS — I4891 Unspecified atrial fibrillation: Secondary | ICD-10-CM | POA: Diagnosis present

## 2014-11-29 LAB — PROTIME-INR
INR: 2.9
Prothrombin Time: 30.4 seconds — ABNORMAL HIGH (ref 11.4–15.0)

## 2014-11-29 NOTE — Telephone Encounter (Signed)
Per wife patient is have problems with bp when standing it drops to 90's/50's.  (no readings available)  Scheduled for opening tomorrow.

## 2014-11-29 NOTE — Telephone Encounter (Signed)
Pt has not been seen in the office before.  He will need appt for eval.  Recommend he keep sched appt tomorrow w/ Dr. Rockey Situ.

## 2014-11-30 ENCOUNTER — Encounter: Payer: Self-pay | Admitting: Cardiovascular Disease

## 2014-11-30 ENCOUNTER — Ambulatory Visit (INDEPENDENT_AMBULATORY_CARE_PROVIDER_SITE_OTHER): Payer: Medicare Other | Admitting: Cardiovascular Disease

## 2014-11-30 VITALS — BP 152/80 | HR 76 | Ht 70.0 in | Wt 144.0 lb

## 2014-11-30 DIAGNOSIS — I482 Chronic atrial fibrillation, unspecified: Secondary | ICD-10-CM

## 2014-11-30 DIAGNOSIS — G20 Parkinson's disease: Secondary | ICD-10-CM

## 2014-11-30 DIAGNOSIS — I951 Orthostatic hypotension: Secondary | ICD-10-CM | POA: Diagnosis not present

## 2014-11-30 DIAGNOSIS — R55 Syncope and collapse: Secondary | ICD-10-CM

## 2014-11-30 DIAGNOSIS — R630 Anorexia: Secondary | ICD-10-CM

## 2014-11-30 NOTE — Assessment & Plan Note (Signed)
Previous episodes of syncope several weeks ago. Felt secondary to orthostasis No recent episodes on Florinef low blood pressure running low at times per the wife. Data not available.

## 2014-11-30 NOTE — Patient Instructions (Signed)
  For low blood pressure (<881 systolic when standing), Please take midodrine 5 mg up to three times a day as needed for blood pressure support This may allow you to work with PT if blood pressure runs low  Please call us if you have new issues that need to be addressed before your next appt.  Your physician wants you to follow-up in: 2 months.

## 2014-11-30 NOTE — Progress Notes (Signed)
Patient ID: Randy Mcknight, male    DOB: 26-May-1931, 81 y.o.   MRN: [id number]  HPI Comments: 81 y.o. male with h/o PAF on warfarin, Parkinson's disease, basal cell, malignant melanoma of the skin, glaucoma, and arthritis who presented to [facility] on 11/09/14 after suffering a fall and 2 possible syncopal episodes. He was started on florinef for orthostatic hypotension with follow-up in the office today for orthostasis. Prior notes indicating systolic pressures in the 80 range, PT unable to work with him and often canceling his sessions. echo from 10/2012 showed an EF of 60-65%, mild aortic sclerosis without stenosis, thickening of the anterior and posterior mitral valve leaflets.   In follow-up today, his wife presents with him She believes that blood pressure continues to drop low at times and he is missing PT sessions. Last week she reports patient was told to lay down, put his feet up, drink fluids for low blood pressure. He was discharged from the hospital on Florinef 0.1 mg daily. Dose was increased up to 0.1 mg twice a day for low blood pressure. Wife reports that he developed severe lower extremity edema. This seems to have improved recently, now down to minimal ankle edema. He is very weak at baseline, walks with a walker. Appetite is been adequate, still has prior weight loss, thinning, muscle weakness  EKG on today's visit shows atrial fibrillation with ventricular rate 76 bpm  Other past medical history On 11/09/2014 He had gotten up from his bed to go to the rest room to have a BM. Upon getting to the restroom he fell.  They worked to get him to a chair. Upon getting him from a laying position to a seating position he had a syncopal episode for an unknown amount of time. EMS was called. Upon EMS arrival when he was again suffered another syncopal episode, again for unknown duration per wife. Per patient he was unaware of any palpitations. No chest pain. He was brought to [facility] for further  evaluation.        Allergies  Allergen Reactions  . Latex   . Ivp Dye [Iodinated Diagnostic Agents] Rash    Current Outpatient Prescriptions on File Prior to Visit  Medication Sig Dispense Refill  . brimonidine (ALPHAGAN P) 0.1 % SOLN Place 1 drop into both eyes 2 (two) times daily.    . carbidopa-levodopa (SINEMET IR) 25-100 MG per tablet Take 2 tablets by mouth 3 (three) times daily.     Marland Kitchen COUMADIN 3 MG tablet Take 3-4 mg by mouth daily. Pt takes 3 mg Sun, Tues, Thurs, Sat and 4 mg on Mon, Wed, Fri    . donepezil (ARICEPT) 10 MG tablet Take 10 mg by mouth at bedtime.    . dorzolamide (TRUSOPT) 2 % ophthalmic solution 1 drop 2 (two) times daily.    . fludrocortisone (FLORINEF) 0.1 MG tablet Take 1 tablet (0.1 mg total) by mouth daily. 30 tablet 0  . latanoprost (XALATAN) 0.005 % ophthalmic solution Place 1 drop into both eyes at bedtime.     No current facility-administered medications on file prior to visit.    Past Medical History  Diagnosis Date  . Arthritis   . Hernia   . Atrial fibrillation   . Parkinson disease   . Glaucoma 2005  . Cancer     Basal cell melanoma  . Malignant melanoma of skin of upper limb, including shoulder December 29, 2007    wide excision Clark level IV,, 3 mm deep malignant  melanoma,, negative margins, negative sentinel nodes.pT3a,N0  . Syncope and collapse   . Orthostatic hypotension     Past Surgical History  Procedure Laterality Date  . Lymph node dissection  2009  . Eye surgery Bilateral   . Melanoma excision  2009  . Replacement total knee Right 2005  . Mass excision Right 01-21-14    right upper arm    Social History  reports that he has never smoked. He has never used smokeless tobacco. He reports that he does not drink alcohol or use illicit drugs.  Family History family history includes Stroke in his mother.   Review of Systems  Constitutional: Positive for fatigue.  Respiratory: Negative.   Cardiovascular: Negative.    Gastrointestinal: Negative.   Musculoskeletal: Positive for gait problem.  Neurological: Positive for weakness.  Hematological: Negative.   Psychiatric/Behavioral: Negative.   All other systems reviewed and are negative.   BP 152/80 mmHg  Pulse 76  Ht 5\' 10"  (1.778 m)  Wt 144 lb (65.318 kg)  BMI 20.66 kg/m2   Physical Exam  Constitutional: He is oriented to person, place, and time.  Thin, presenting in a wheelchair  HENT:  Head: Normocephalic.  Nose: Nose normal.  Mouth/Throat: Oropharynx is clear and moist.  Eyes: Conjunctivae are normal. Pupils are equal, round, and reactive to light.  Neck: Normal range of motion. Neck supple. No JVD present.  Cardiovascular: Normal rate, regular rhythm, normal heart sounds and intact distal pulses.  Exam reveals no gallop and no friction rub.   No murmur heard. Trace edema around the ankles laterally  Pulmonary/Chest: Effort normal and breath sounds normal. No respiratory distress. He has no wheezes. He has no rales. He exhibits no tenderness.  Abdominal: Soft. Bowel sounds are normal. He exhibits no distension. There is no tenderness.  Musculoskeletal: Normal range of motion. He exhibits edema. He exhibits no tenderness.  Lymphadenopathy:    He has no cervical adenopathy.  Neurological: He is alert and oriented to person, place, and time. Coordination normal.  Skin: Skin is warm and dry. No rash noted. No erythema.  Psychiatric:  Quiet, able to converse and answer questions

## 2014-11-30 NOTE — Assessment & Plan Note (Addendum)
Currently on Florinef 0.1 mg twice a day. Wife reports severe leg edema has improved recently, now trace ankle edema. If edema gets worse, may need to decrease the Florinef back to 0.1 mg daily. Recent lab work 2 weeks ago was stable. No electrolyte abnormalities. Wife reports he continues to have orthostasis and difficulty working with PT. Recommended he take midodrine 5 mg 3 times a day when necessary for systolic pressure less than 110. Dose could be increased or decreased depending on the need. Encouraged continued use of compression hose, minimizing time spent in bed.  Recommend he drink fluids first thing in the morning as he does have nocturia.

## 2014-11-30 NOTE — Assessment & Plan Note (Signed)
Wife reports history of Parkinson's. This is likely because of his orthostatic hypotension. Can be treated with Florinef and midodrine if needed Currently on carbidopa levodopa, Aricept. Diagnosis 11 years ago

## 2014-11-30 NOTE — Assessment & Plan Note (Signed)
Significant muscle wasting, very thin. Wife has been encouraging calorie intake at rehabilitation

## 2014-12-06 DIAGNOSIS — I4891 Unspecified atrial fibrillation: Secondary | ICD-10-CM | POA: Diagnosis not present

## 2014-12-06 LAB — PROTIME-INR
INR: 2.78
Prothrombin Time: 29.4 seconds — ABNORMAL HIGH (ref 11.4–15.0)

## 2014-12-13 DIAGNOSIS — I4891 Unspecified atrial fibrillation: Secondary | ICD-10-CM | POA: Diagnosis not present

## 2014-12-13 LAB — CBC WITH DIFFERENTIAL/PLATELET
Basophils Absolute: 0 10*3/uL (ref 0–0.1)
Basophils Relative: 1 %
Eosinophils Absolute: 0.1 10*3/uL (ref 0–0.7)
Eosinophils Relative: 2 %
HEMATOCRIT: 29.9 % — AB (ref 40.0–52.0)
HEMOGLOBIN: 9.7 g/dL — AB (ref 13.0–18.0)
LYMPHS ABS: 0.6 10*3/uL — AB (ref 1.0–3.6)
Lymphocytes Relative: 13 %
MCH: 27.9 pg (ref 26.0–34.0)
MCHC: 32.5 g/dL (ref 32.0–36.0)
MCV: 85.8 fL (ref 80.0–100.0)
Monocytes Absolute: 0.6 10*3/uL (ref 0.2–1.0)
Monocytes Relative: 12 %
NEUTROS ABS: 3.3 10*3/uL (ref 1.4–6.5)
Neutrophils Relative %: 72 %
Platelets: 217 10*3/uL (ref 150–440)
RBC: 3.48 MIL/uL — AB (ref 4.40–5.90)
RDW: 15.6 % — ABNORMAL HIGH (ref 11.5–14.5)
WBC: 4.6 10*3/uL (ref 3.8–10.6)

## 2014-12-13 LAB — COMPREHENSIVE METABOLIC PANEL
ALK PHOS: 58 U/L (ref 38–126)
ALT: 6 U/L — ABNORMAL LOW (ref 17–63)
AST: 16 U/L (ref 15–41)
Albumin: 2.6 g/dL — ABNORMAL LOW (ref 3.5–5.0)
Anion gap: 7 (ref 5–15)
BUN: 15 mg/dL (ref 6–20)
CO2: 32 mmol/L (ref 22–32)
Calcium: 7.9 mg/dL — ABNORMAL LOW (ref 8.9–10.3)
Chloride: 102 mmol/L (ref 101–111)
Creatinine, Ser: 0.76 mg/dL (ref 0.61–1.24)
GFR calc Af Amer: >60 mL/min (ref >60)
Glucose, Bld: 94 mg/dL (ref 65–99)
Potassium: 2.7 mmol/L — CL (ref 3.5–5.1)
SODIUM: 141 mmol/L (ref 135–145)
TOTAL PROTEIN: 6.2 g/dL — AB (ref 6.5–8.1)
Total Bilirubin: 0.3 mg/dL (ref 0.3–1.2)

## 2014-12-13 LAB — TSH: TSH: 3.6 u[IU]/mL (ref 0.350–4.500)

## 2014-12-13 LAB — PROTIME-INR
INR: 3.81
Prothrombin Time: 37.5 seconds — ABNORMAL HIGH (ref 11.4–15.0)

## 2014-12-13 LAB — MAGNESIUM: Magnesium: 1.9 mg/dL (ref 1.7–2.4)

## 2014-12-13 LAB — VITAMIN B12: VITAMIN B 12: 362 pg/mL (ref 180–914)

## 2014-12-14 DIAGNOSIS — I4891 Unspecified atrial fibrillation: Secondary | ICD-10-CM | POA: Diagnosis not present

## 2014-12-14 LAB — URINALYSIS COMPLETE WITH MICROSCOPIC (ARMC ONLY)
BILIRUBIN URINE: NEGATIVE
Bacteria, UA: NONE SEEN
Glucose, UA: NEGATIVE mg/dL
HGB URINE DIPSTICK: NEGATIVE
Leukocytes, UA: NEGATIVE
Nitrite: NEGATIVE
Protein, ur: 30 mg/dL — AB
Specific Gravity, Urine: 1.019 (ref 1.005–1.030)
Squamous Epithelial / LPF: NONE SEEN
pH: 6 (ref 5.0–8.0)

## 2014-12-15 DIAGNOSIS — I4891 Unspecified atrial fibrillation: Secondary | ICD-10-CM | POA: Diagnosis not present

## 2014-12-15 LAB — BASIC METABOLIC PANEL
Anion gap: 8 (ref 5–15)
BUN: 12 mg/dL (ref 6–20)
CO2: 29 mmol/L (ref 22–32)
CREATININE: 0.7 mg/dL (ref 0.61–1.24)
Calcium: 8.3 mg/dL — ABNORMAL LOW (ref 8.9–10.3)
Chloride: 104 mmol/L (ref 101–111)
GFR calc Af Amer: >60 mL/min (ref >60)
GFR calc non Af Amer: >60 mL/min (ref >60)
GLUCOSE: 94 mg/dL (ref 65–99)
Potassium: 3.5 mmol/L (ref 3.5–5.1)
Sodium: 141 mmol/L (ref 135–145)

## 2014-12-15 LAB — PROTIME-INR
INR: 4.74
PROTHROMBIN TIME: 44.3 s — AB (ref 11.4–15.0)

## 2014-12-16 ENCOUNTER — Other Ambulatory Visit
Admission: RE | Admit: 2014-12-16 | Discharge: 2014-12-16 | Disposition: A | Discharge status: Home / Self Care | Payer: Medicare Other | Source: Ambulatory Visit | Attending: Gerontology | Admitting: Gerontology

## 2014-12-16 DIAGNOSIS — I482 Chronic atrial fibrillation: Secondary | ICD-10-CM | POA: Diagnosis present

## 2014-12-16 LAB — URINE CULTURE

## 2014-12-16 LAB — PROTIME-INR
INR: 3.9
Prothrombin Time: 38.2 seconds — ABNORMAL HIGH (ref 11.4–15.0)

## 2014-12-17 ENCOUNTER — Inpatient Hospital Stay: Payer: Medicare Other

## 2014-12-17 ENCOUNTER — Inpatient Hospital Stay
Admission: EM | Admit: 2014-12-17 | Discharge: 2014-12-20 | DRG: 291 | Disposition: A | Discharge status: Skilled Nursing Facility | Payer: Medicare Other | Attending: Internal Medicine | Admitting: Internal Medicine

## 2014-12-17 ENCOUNTER — Encounter: Payer: Self-pay | Admitting: Internal Medicine

## 2014-12-17 ENCOUNTER — Emergency Department: Payer: Medicare Other

## 2014-12-17 DIAGNOSIS — M1990 Unspecified osteoarthritis, unspecified site: Secondary | ICD-10-CM | POA: Diagnosis present

## 2014-12-17 DIAGNOSIS — C7951 Secondary malignant neoplasm of bone: Secondary | ICD-10-CM | POA: Diagnosis present

## 2014-12-17 DIAGNOSIS — I4891 Unspecified atrial fibrillation: Secondary | ICD-10-CM | POA: Diagnosis not present

## 2014-12-17 DIAGNOSIS — C649 Malignant neoplasm of unspecified kidney, except renal pelvis: Secondary | ICD-10-CM | POA: Diagnosis present

## 2014-12-17 DIAGNOSIS — I482 Chronic atrial fibrillation: Secondary | ICD-10-CM | POA: Diagnosis present

## 2014-12-17 DIAGNOSIS — D72829 Elevated white blood cell count, unspecified: Secondary | ICD-10-CM | POA: Diagnosis present

## 2014-12-17 DIAGNOSIS — R0602 Shortness of breath: Secondary | ICD-10-CM

## 2014-12-17 DIAGNOSIS — I480 Paroxysmal atrial fibrillation: Secondary | ICD-10-CM | POA: Diagnosis present

## 2014-12-17 DIAGNOSIS — R229 Localized swelling, mass and lump, unspecified: Secondary | ICD-10-CM

## 2014-12-17 DIAGNOSIS — J80 Acute respiratory distress syndrome: Secondary | ICD-10-CM | POA: Diagnosis present

## 2014-12-17 DIAGNOSIS — I471 Supraventricular tachycardia: Secondary | ICD-10-CM | POA: Diagnosis not present

## 2014-12-17 DIAGNOSIS — E876 Hypokalemia: Secondary | ICD-10-CM | POA: Diagnosis present

## 2014-12-17 DIAGNOSIS — Z823 Family history of stroke: Secondary | ICD-10-CM

## 2014-12-17 DIAGNOSIS — Z9889 Other specified postprocedural states: Secondary | ICD-10-CM

## 2014-12-17 DIAGNOSIS — D62 Acute posthemorrhagic anemia: Secondary | ICD-10-CM | POA: Diagnosis present

## 2014-12-17 DIAGNOSIS — Z66 Do not resuscitate: Secondary | ICD-10-CM | POA: Diagnosis not present

## 2014-12-17 DIAGNOSIS — Z91040 Latex allergy status: Secondary | ICD-10-CM

## 2014-12-17 DIAGNOSIS — J9601 Acute respiratory failure with hypoxia: Secondary | ICD-10-CM | POA: Diagnosis present

## 2014-12-17 DIAGNOSIS — I951 Orthostatic hypotension: Secondary | ICD-10-CM | POA: Diagnosis present

## 2014-12-17 DIAGNOSIS — R0603 Acute respiratory distress: Secondary | ICD-10-CM | POA: Diagnosis present

## 2014-12-17 DIAGNOSIS — I472 Ventricular tachycardia: Secondary | ICD-10-CM | POA: Diagnosis present

## 2014-12-17 DIAGNOSIS — N2889 Other specified disorders of kidney and ureter: Secondary | ICD-10-CM | POA: Diagnosis present

## 2014-12-17 DIAGNOSIS — H409 Unspecified glaucoma: Secondary | ICD-10-CM | POA: Diagnosis present

## 2014-12-17 DIAGNOSIS — J90 Pleural effusion, not elsewhere classified: Secondary | ICD-10-CM | POA: Diagnosis present

## 2014-12-17 DIAGNOSIS — Z7901 Long term (current) use of anticoagulants: Secondary | ICD-10-CM

## 2014-12-17 DIAGNOSIS — G20 Parkinson's disease: Secondary | ICD-10-CM | POA: Diagnosis present

## 2014-12-17 DIAGNOSIS — Z91041 Radiographic dye allergy status: Secondary | ICD-10-CM | POA: Diagnosis not present

## 2014-12-17 DIAGNOSIS — Z96651 Presence of right artificial knee joint: Secondary | ICD-10-CM | POA: Diagnosis present

## 2014-12-17 DIAGNOSIS — S37012A Minor contusion of left kidney, initial encounter: Secondary | ICD-10-CM | POA: Diagnosis present

## 2014-12-17 DIAGNOSIS — Z79899 Other long term (current) drug therapy: Secondary | ICD-10-CM

## 2014-12-17 DIAGNOSIS — Z85820 Personal history of malignant melanoma of skin: Secondary | ICD-10-CM | POA: Diagnosis not present

## 2014-12-17 DIAGNOSIS — I5033 Acute on chronic diastolic (congestive) heart failure: Secondary | ICD-10-CM | POA: Diagnosis present

## 2014-12-17 DIAGNOSIS — IMO0002 Reserved for concepts with insufficient information to code with codable children: Secondary | ICD-10-CM

## 2014-12-17 HISTORY — DX: Paroxysmal atrial fibrillation: I48.0

## 2014-12-17 HISTORY — DX: Chronic diastolic (congestive) heart failure: I50.32

## 2014-12-17 LAB — HEPATIC FUNCTION PANEL
ALBUMIN: 2.7 g/dL — AB (ref 3.5–5.0)
ALT: 15 U/L — ABNORMAL LOW (ref 17–63)
AST: 21 U/L (ref 15–41)
Alkaline Phosphatase: 62 U/L (ref 38–126)
BILIRUBIN TOTAL: 0.6 mg/dL (ref 0.3–1.2)
Bilirubin, Direct: 0.1 mg/dL (ref 0.1–0.5)
Indirect Bilirubin: 0.5 mg/dL (ref 0.3–0.9)
Total Protein: 6.5 g/dL (ref 6.5–8.1)

## 2014-12-17 LAB — BASIC METABOLIC PANEL
Anion gap: 10 (ref 5–15)
BUN: 16 mg/dL (ref 6–20)
CHLORIDE: 101 mmol/L (ref 101–111)
CO2: 27 mmol/L (ref 22–32)
CREATININE: 0.82 mg/dL (ref 0.61–1.24)
Calcium: 8.1 mg/dL — ABNORMAL LOW (ref 8.9–10.3)
Glucose, Bld: 124 mg/dL — ABNORMAL HIGH (ref 65–99)
Potassium: 2.8 mmol/L — ABNORMAL LOW (ref 3.5–5.1)
Sodium: 138 mmol/L (ref 135–145)

## 2014-12-17 LAB — TSH: TSH: 1.584 u[IU]/mL (ref 0.350–4.500)

## 2014-12-17 LAB — CBC
HEMATOCRIT: 29.1 % — AB (ref 40.0–52.0)
Hemoglobin: 9.4 g/dL — ABNORMAL LOW (ref 13.0–18.0)
MCH: 27.5 pg (ref 26.0–34.0)
MCHC: 32.3 g/dL (ref 32.0–36.0)
MCV: 85 fL (ref 80.0–100.0)
Platelets: 272 10*3/uL (ref 150–440)
RBC: 3.43 MIL/uL — ABNORMAL LOW (ref 4.40–5.90)
RDW: 15.5 % — ABNORMAL HIGH (ref 11.5–14.5)
WBC: 10.7 10*3/uL — ABNORMAL HIGH (ref 3.8–10.6)

## 2014-12-17 LAB — PROTIME-INR
INR: 4.89 — AB
PROTHROMBIN TIME: 45.4 s — AB (ref 11.4–15.0)

## 2014-12-17 LAB — TROPONIN I
TROPONIN I: 0.03 ng/mL (ref <0.031)
Troponin I: <0.03 ng/mL (ref <0.031)
Troponin I: <0.03 ng/mL (ref <0.031)
Troponin I: 0.04 ng/mL — ABNORMAL HIGH (ref <0.031)

## 2014-12-17 LAB — BRAIN NATRIURETIC PEPTIDE: B NATRIURETIC PEPTIDE 5: 295 pg/mL — AB (ref 0.0–100.0)

## 2014-12-17 MED ORDER — ONDANSETRON HCL 4 MG PO TABS: 4.0000 mg | ORAL_TABLET | Freq: Four times a day (QID) | ORAL | Status: DC | PRN

## 2014-12-17 MED ORDER — LATANOPROST 0.005 % OP SOLN
1.0000 [drp] | Freq: Every day | OPHTHALMIC | Status: DC
  Administered 2014-12-17 – 2014-12-19 (×3): 1 [drp] via OPHTHALMIC
  Filled 2014-12-17: qty 2.5

## 2014-12-17 MED ORDER — DILTIAZEM HCL 60 MG PO TABS
30.0000 mg | ORAL_TABLET | Freq: Four times a day (QID) | ORAL | Status: AC
  Administered 2014-12-17: 30 mg via ORAL
  Filled 2014-12-17: qty 1

## 2014-12-17 MED ORDER — ASPIRIN EC 81 MG PO TBEC: 81.0000 mg | DELAYED_RELEASE_TABLET | Freq: Every day | ORAL | Status: DC

## 2014-12-17 MED ORDER — SODIUM CHLORIDE 0.9 % IV SOLN
Freq: Once | INTRAVENOUS | Status: AC
  Administered 2014-12-17: 04:00:00 via INTRAVENOUS

## 2014-12-17 MED ORDER — POTASSIUM CHLORIDE CRYS ER 20 MEQ PO TBCR
20.0000 meq | EXTENDED_RELEASE_TABLET | Freq: Two times a day (BID) | ORAL | Status: DC
  Administered 2014-12-17 – 2014-12-20 (×7): 20 meq via ORAL
  Filled 2014-12-17 (×7): qty 1

## 2014-12-17 MED ORDER — DORZOLAMIDE HCL 2 % OP SOLN
1.0000 [drp] | Freq: Two times a day (BID) | OPHTHALMIC | Status: DC
  Administered 2014-12-17 – 2014-12-20 (×6): 1 [drp] via OPHTHALMIC
  Filled 2014-12-17: qty 10

## 2014-12-17 MED ORDER — DILTIAZEM HCL 25 MG/5ML IV SOLN
10.0000 mg | Freq: Once | INTRAVENOUS | Status: AC
  Administered 2014-12-17: 10 mg via INTRAVENOUS
  Filled 2014-12-17: qty 5

## 2014-12-17 MED ORDER — ALBUTEROL SULFATE (2.5 MG/3ML) 0.083% IN NEBU: 2.5000 mg | INHALATION_SOLUTION | RESPIRATORY_TRACT | Status: DC | PRN

## 2014-12-17 MED ORDER — SODIUM CHLORIDE 0.9 % IJ SOLN
3.0000 mL | Freq: Two times a day (BID) | INTRAMUSCULAR | Status: DC
  Administered 2014-12-17 – 2014-12-19 (×6): 3 mL via INTRAVENOUS

## 2014-12-17 MED ORDER — DONEPEZIL HCL 5 MG PO TABS
10.0000 mg | ORAL_TABLET | Freq: Every day | ORAL | Status: DC
  Administered 2014-12-17 – 2014-12-19 (×3): 10 mg via ORAL
  Filled 2014-12-17 (×3): qty 2

## 2014-12-17 MED ORDER — VITAMIN B-12 1000 MCG PO TABS
2000.0000 ug | ORAL_TABLET | Freq: Every day | ORAL | Status: DC
  Administered 2014-12-17 – 2014-12-20 (×4): 2000 ug via ORAL
  Filled 2014-12-17 (×4): qty 2

## 2014-12-17 MED ORDER — ONDANSETRON HCL 4 MG/2ML IJ SOLN: 4.0000 mg | Freq: Four times a day (QID) | INTRAMUSCULAR | Status: DC | PRN

## 2014-12-17 MED ORDER — BISACODYL 10 MG RE SUPP: 10.0000 mg | Freq: Every day | RECTAL | Status: DC | PRN

## 2014-12-17 MED ORDER — MIDODRINE HCL 5 MG PO TABS: 5.0000 mg | ORAL_TABLET | Freq: Three times a day (TID) | ORAL | Status: DC | PRN

## 2014-12-17 MED ORDER — FLUDROCORTISONE ACETATE 0.1 MG PO TABS: 0.1000 mg | ORAL_TABLET | Freq: Every day | ORAL | Status: DC

## 2014-12-17 MED ORDER — PRO-STAT SUGAR FREE PO LIQD
30.0000 mL | Freq: Two times a day (BID) | ORAL | Status: DC
  Administered 2014-12-17 – 2014-12-20 (×3): 30 mL via ORAL

## 2014-12-17 MED ORDER — CARBIDOPA-LEVODOPA ER 25-100 MG PO TBCR
2.0000 | EXTENDED_RELEASE_TABLET | Freq: Three times a day (TID) | ORAL | Status: DC
  Administered 2014-12-17 – 2014-12-20 (×9): 2 via ORAL
  Filled 2014-12-17 (×9): qty 2

## 2014-12-17 MED ORDER — VITAMIN K1 10 MG/ML IJ SOLN
5.0000 mg | Freq: Once | INTRAMUSCULAR | Status: AC
  Administered 2014-12-17: 0.5 mg via SUBCUTANEOUS
  Filled 2014-12-17: qty 0.5

## 2014-12-17 MED ORDER — SENNA 8.6 MG PO TABS
1.0000 | ORAL_TABLET | Freq: Two times a day (BID) | ORAL | Status: DC
  Administered 2014-12-17 – 2014-12-20 (×7): 8.6 mg via ORAL
  Filled 2014-12-17 (×7): qty 1

## 2014-12-17 MED ORDER — LEVOFLOXACIN IN D5W 750 MG/150ML IV SOLN
750.0000 mg | Freq: Once | INTRAVENOUS | Status: AC
  Administered 2014-12-17: 750 mg via INTRAVENOUS
  Filled 2014-12-17: qty 150

## 2014-12-17 MED ORDER — ACETAMINOPHEN 325 MG PO TABS
650.0000 mg | ORAL_TABLET | Freq: Four times a day (QID) | ORAL | Status: DC | PRN
  Administered 2014-12-19 (×2): 650 mg via ORAL
  Filled 2014-12-17 (×3): qty 2

## 2014-12-17 MED ORDER — BRIMONIDINE TARTRATE 0.15 % OP SOLN
1.0000 [drp] | Freq: Two times a day (BID) | OPHTHALMIC | Status: DC
  Administered 2014-12-17 – 2014-12-20 (×6): 1 [drp] via OPHTHALMIC
  Filled 2014-12-17: qty 5

## 2014-12-17 MED ORDER — LEVOFLOXACIN IN D5W 500 MG/100ML IV SOLN
500.0000 mg | INTRAVENOUS | Status: DC
  Administered 2014-12-18 – 2014-12-19 (×2): 500 mg via INTRAVENOUS
  Filled 2014-12-17 (×3): qty 100

## 2014-12-17 NOTE — H&P (Signed)
[facility] NAME: Randy Mcknight    MR#:  [id number]  DATE OF BIRTH:  05-Jan-1931  DATE OF ADMISSION:  12/17/2014  PRIMARY CARE PHYSICIAN: Lelon Huh, MD   REQUESTING/REFERRING PHYSICIAN: Dr. Dahlia Client  CHIEF COMPLAINT:   Chief Complaint  Patient presents with  . Shortness of Breath  . Chest Pain  . Tachycardia    HISTORY OF PRESENT ILLNESS:  Randy Mcknight  is a 81 y.o. male, and nursing home resident, with a known history of Parkinson's disease, paroxysmal atrial fibrillation on warfarin, orthostatic hypotension was brought in by the EMS with nursing home note stating acute respiratory distress with chest pain and low-grade temperature of 99.5. According to the patient's wife who is with the patient this time she received a call from the nursing home staff stating that patient is having shortness of breath with some chest discomfort hence EMS was called in. EMS gave him Solu-Medrol and DuoNeb en route to the ER. Patient's wife stated that he was recently started on torsemide for chronic pedal edema 3 days ago and since then has been feeling weak, not eating well. No history of any nausea vomiting. In the emergency room patient was evaluated by the ED physician and was noted to have atrial fibrillation with a rapid ventricular rate of 1 23 bpm, was given IV Cardizem 10 mg following which her heart rate improved and is currently maintaining in the upper 90s. Patient has been chest pain-free while in the ED, did not have any shortness of breath and has been maintaining O2 saturations in the upper 90s. Workup revealed chest x-ray with moderate left pleural effusion with associated left basilar opacity reflecting atelectasis versus consolidation. EKG atrial fibrillation with ventricular rate of 1 23 bpm, nonspecific ST-T changes. Lab work with elevated WBC of 10.7, H&H 9.4 over 29.1, potassium 2.8, troponin less than 0.03. INR  elevated at 3.9. Of note per his wife off Coumadin was on hold for the past 3 days because of elevated INR. After obtaining blood cultures patient was started on IV Levaquin by the ED physician for possible pneumonia and hospitalist service was consulted for further management. At the current time patient is comfortably resting in the bed, able to follow commands, responding monosyllabically, states feeling better.  PAST MEDICAL HISTORY:   Past Medical History  Diagnosis Date  . Arthritis   . Hernia   . Atrial fibrillation   . Parkinson disease   . Glaucoma 2005  . Cancer     Basal cell melanoma  . Malignant melanoma of skin of upper limb, including shoulder December 29, 2007    wide excision Clark level IV,, 3 mm deep malignant melanoma,, negative margins, negative sentinel nodes.pT3a,N0  . Syncope and collapse   . Orthostatic hypotension     PAST SURGICAL HISTORY:   Past Surgical History  Procedure Laterality Date  . Lymph node dissection  2009  . Eye surgery Bilateral   . Melanoma excision  2009  . Replacement total knee Right 2005  . Mass excision Right 01-21-14    right upper arm  . Joint replacement      SOCIAL HISTORY:   History  Substance Use Topics  . Smoking status: Never Smoker   . Smokeless tobacco: Never Used  . Alcohol Use: No    FAMILY HISTORY:   Family History  Problem Relation Age of Onset  . Stroke Mother   . Atrial fibrillation Brother  DRUG ALLERGIES:   Allergies  Allergen Reactions  . Ivp Dye [Iodinated Diagnostic Agents] Rash  . Latex Rash    REVIEW OF SYSTEMS:   Review of Systems  Constitutional: Positive for fever and malaise/fatigue. Negative for chills.  HENT: Negative for ear pain, hearing loss, nosebleeds, sore throat and tinnitus.   Eyes: Negative for blurred vision, double vision, pain, discharge and redness.  Respiratory: Positive for shortness of breath and wheezing. Negative for cough, hemoptysis and sputum production.    Cardiovascular: Positive for chest pain. Negative for palpitations, orthopnea and leg swelling.  Gastrointestinal: Negative for nausea, vomiting, abdominal pain, diarrhea, constipation, blood in stool and melena.  Genitourinary: Negative for dysuria, urgency, frequency and hematuria.  Musculoskeletal: Negative for back pain, joint pain and neck pain.  Skin: Negative for itching and rash.  Neurological: Negative for dizziness, tingling, sensory change, focal weakness and seizures.  Endo/Heme/Allergies: Does not bruise/bleed easily.  Psychiatric/Behavioral: Negative for depression. The patient is not nervous/anxious.     MEDICATIONS AT HOME:   Prior to Admission medications   Medication Sig Start Date End Date Taking? Authorizing Provider  acetaminophen (TYLENOL) 325 MG tablet Take 650 mg by mouth every 4 (four) hours as needed.   Yes Historical Provider, MD  Amino Acids-Protein Hydrolys (FEEDING SUPPLEMENT, PRO-STAT SUGAR FREE 64,) LIQD Take 30 mLs by mouth 2 (two) times daily between meals.   Yes Historical Provider, MD  bisacodyl (DULCOLAX) 10 MG suppository Place 10 mg rectally daily as needed for moderate constipation.   Yes Historical Provider, MD  brimonidine (ALPHAGAN P) 0.1 % SOLN Place 1 drop into both eyes 2 (two) times daily.   Yes Historical Provider, MD  carbidopa-levodopa (SINEMET IR) 25-100 MG per tablet Take 2 tablets by mouth 3 (three) times daily.  12/30/12  Yes Historical Provider, MD  COUMADIN 3 MG tablet Take 3-4 mg by mouth daily. Pt takes 3 mg Sun, Tues, Thurs, Sat and 4 mg on Mon, Wed, Fri 12/28/13  Yes Historical Provider, MD  donepezil (ARICEPT) 10 MG tablet Take 10 mg by mouth at bedtime.   Yes Historical Provider, MD  dorzolamide (TRUSOPT) 2 % ophthalmic solution 1 drop 2 (two) times daily.   Yes Historical Provider, MD  ENSURE (ENSURE) Take 237 mLs by mouth 2 (two) times daily between meals.   Yes Historical Provider, MD  fludrocortisone (FLORINEF) 0.1 MG tablet Take  1 tablet (0.1 mg total) by mouth daily. 11/10/14  Yes Sital Mody, MD  latanoprost (XALATAN) 0.005 % ophthalmic solution Place 1 drop into both eyes at bedtime.   Yes Historical Provider, MD  magnesium hydroxide (MILK OF MAGNESIA) 800 MG/5ML suspension Take 30 mLs by mouth daily as needed for constipation.   Yes Historical Provider, MD  midodrine (PROAMATINE) 5 MG tablet Take 1 tablet (5 mg total) by mouth 3 (three) times daily as needed (for SBP <110). 11/30/14  Yes Minna Merritts, MD  potassium chloride SA (K-DUR,KLOR-CON) 20 MEQ tablet Take 20 mEq by mouth 2 (two) times daily.   Yes Historical Provider, MD  senna (SENOKOT) 8.6 MG TABS tablet Take 1 tablet by mouth 2 (two) times daily.   Yes Historical Provider, MD  sorbitol 70 % solution Take 30 mLs by mouth every 6 (six) hours as needed.   Yes Historical Provider, MD  torsemide (DEMADEX) 5 MG tablet Take 5 mg by mouth daily.   Yes Historical Provider, MD  vitamin B-12 (CYANOCOBALAMIN) 1000 MCG tablet Take 2,000 mcg by mouth daily.  Yes Historical Provider, MD      VITAL SIGNS:  Blood pressure 129/77, pulse 115, temperature 97.8 F (36.6 C), temperature source Oral, resp. rate 16, height 5\' 10"  (1.778 m), weight 65.772 kg (145 lb), SpO2 98 %.  PHYSICAL EXAMINATION:  Physical Exam  Constitutional: He is oriented to person, place, and time. No distress.  Elderly male  HENT:  Head: Normocephalic and atraumatic.  Right Ear: External ear normal.  Left Ear: External ear normal.  Nose: Nose normal.  Mouth/Throat: Oropharynx is clear and moist. No oropharyngeal exudate.  Eyes: EOM are normal. Pupils are equal, round, and reactive to light. No scleral icterus.  Neck: Normal range of motion. Neck supple. No JVD present. No thyromegaly present.  Cardiovascular: Normal rate, normal heart sounds and intact distal pulses.  Exam reveals no friction rub.   No murmur heard. Irregularly irregular rhythm  Respiratory: Effort normal. No respiratory  distress. He has no wheezes. He has no rales. He exhibits no tenderness.  Diminished air entry left base +  GI: Soft. Bowel sounds are normal. He exhibits no distension and no mass. There is no tenderness. There is no rebound and no guarding.  Musculoskeletal: Normal range of motion. He exhibits edema.  Lymphadenopathy:    He has no cervical adenopathy.  Neurological: He is alert and oriented to person, place, and time. He has normal reflexes. He displays normal reflexes. No cranial nerve deficit. He exhibits normal muscle tone.  Skin: Skin is warm. No rash noted. No erythema.  Psychiatric: He has a normal mood and affect. His behavior is normal. Thought content normal.   LABORATORY PANEL:   CBC  Recent Labs Lab 12/17/14 0327  WBC 10.7*  HGB 9.4*  HCT 29.1*  PLT 272   ------------------------------------------------------------------------------------------------------------------  Chemistries   Recent Labs Lab 12/13/14 1059  12/17/14 0327  NA 141  < > 138  K 2.7*  < > 2.8*  CL 102  < > 101  CO2 32  < > 27  GLUCOSE 94  < > 124*  BUN 15  < > 16  CREATININE 0.76  < > 0.82  CALCIUM 7.9*  < > 8.1*  MG 1.9  --   --   AST 16  --   --   ALT 6*  --   --   ALKPHOS 58  --   --   BILITOT 0.3  --   --   < > = values in this interval not displayed. ------------------------------------------------------------------------------------------------------------------  Cardiac Enzymes  Recent Labs Lab 12/17/14 0327  TROPONINI <0.03   ------------------------------------------------------------------------------------------------------------------  RADIOLOGY:  Dg Chest Portable 1 View  12/17/2014   CLINICAL DATA:  Initial evaluation for acute shortness of breath in chest pain for 1 week.  EXAM: PORTABLE CHEST - 1 VIEW  COMPARISON:  Prior radiograph from 11/12/2014  FINDINGS: Cardiomegaly is grossly stable from previous. Mediastinal silhouette within normal limits.  Moderate left  pleural effusion present. Associated left basilar opacity may reflect atelectasis and/ or consolidation. Right lung is clear. No pulmonary edema. No right-sided effusion. No pneumothorax.  Severe degenerative changes present at the right shoulder. No acute osseus abnormality.  IMPRESSION: 1. Moderate left pleural effusion with associated left basilar opacity, which may reflect atelectasis or consolidation. 2. Grossly stable cardiomegaly.  No pulmonary edema.   Electronically Signed   By: Jeannine Boga M.D.   On: 12/17/2014 05:06    EKG:   Orders placed or performed during the hospital encounter of 12/17/14  .  ED EKG within 10 minutes  . ED EKG within 10 minutes  . EKG 12-Lead  . EKG 12-Lead  Additional fibrillation with ventricular rate of 1 23 bpm, nonspecific ST-T changes.  IMPRESSION AND PLAN:   1. Acute respiratory distress. Per nursing home transfer note patient febrile. Elevated WBC of 10.7. Chest x-ray left pleural effusion with associated left basilar opacity reflecting consolidation versus atelectasis. Possible pneumonia. Plan: Admit to telemetry, O2 supplementation as needed, continue IV Levaquin, albuterol neb as needed. Order echocardiogram, cycle cardiac enzymes. Follow-up CBC and cultures. 2. Left pleural effusion, etiology not clear. Per patient's wife he has been retaining fluid since he has been started on Florinef. Needs further workup for evaluation of pleural effusion. Pulmonary consultation requested. 3. History of past medical atrial fibrillation now with rapid regular rate. Patient received IV Cardizem 10 mg in the ED, rate improved. Start by mouth Cardizem monitor heart rate closely. Echocardiogram and cardiology consultation requested. Patient on chronic anticoagulation, INR elevated, hence Coumadin on hold. 4. Episode of chest pain, atypical. Troponin 1 WNL. Plan: Cycle cardiac enzymes, echo requested. Cardiology consultation for further advice. 5. Hypokalemia.  Replace potassium follow-up BMP. 6. Chronic anticoagulation, on Coumadin-on hold because of elevated INR. INR 3.9. Hold Coumadin for now. Follow-up pro time per pharmacy. 7. History of orthostatic hypotension, patient on Florinef. Patient stable. BP in reasonable range. Fall precautions 8. Parkinson's disease, stable on home medications. Continue same.    All the records are reviewed and case discussed with ED provider. Management plans discussed with the patient, family and they are in agreement.  CODE STATUS: Full code  TOTAL TIME TAKING CARE OF THIS PATIENT: 50 minutes.    Juluis Mire M.D on 12/17/2014 at 7:11 AM  Between 7am to 6pm - Pager - [phone]  After 6pm go to [email or web address] - password [email or web address] [facility]  Office  [phone]  CC: Primary care physician; Lelon Huh, MD

## 2014-12-17 NOTE — Progress Notes (Signed)
[facility] [place] NAME: Randy Mcknight    MR#:  [id number]  DATE OF BIRTH:  [birth year]  SUBJECTIVE:  Came in with increasing sob from [facility] with chest tightness. Found to have large effusion  REVIEW OF SYSTEMS:   Review of Systems  Constitutional: Negative for fever, chills and weight loss.  HENT: Negative for ear discharge, ear pain and nosebleeds.   Eyes: Negative for blurred vision, pain and discharge.  Respiratory: Positive for shortness of breath. Negative for sputum production, wheezing and stridor.   Cardiovascular: Positive for chest pain and leg swelling. Negative for palpitations, orthopnea and PND.  Gastrointestinal: Negative for nausea, vomiting, abdominal pain and diarrhea.  Genitourinary: Negative for urgency and frequency.  Musculoskeletal: Negative for back pain and joint pain.  Neurological: Positive for weakness. Negative for sensory change, speech change and focal weakness.  Psychiatric/Behavioral: Negative for depression. The patient is not nervous/anxious.   All other systems reviewed and are negative.  Tolerating Diet:yes Tolerating PT: yet to eval  DRUG ALLERGIES:   Allergies  Allergen Reactions  . Ivp Dye [Iodinated Diagnostic Agents] Rash  . Latex Rash    VITALS:  Blood pressure 137/74, pulse 105, temperature 98.1 F (36.7 C), temperature source Oral, resp. rate 16, height 5\' 10"  (1.778 m), weight 65.772 kg (145 lb), SpO2 99 %.  PHYSICAL EXAMINATION:   Physical Exam  GENERAL:  80 y.o.-year-old patient lying in the bed with no acute distress.  EYES: Pupils equal, round, reactive to light and accommodation. No scleral icterus. Extraocular muscles intact.  HEENT: Head atraumatic, normocephalic. Oropharynx and nasopharynx clear.dry olra mucosa  NECK:  Supple, no jugular venous distention. No thyroid enlargement, no tenderness.  LUNGS: decreased breath sounds bilaterally left >right, no  wheezing, rales, rhonchi. No use of accessory muscles of respiration.  CARDIOVASCULAR: S1, S2 normal. No murmurs, rubs, or gallops.  ABDOMEN: Soft, nontender, nondistended. Bowel sounds present. No organomegaly or mass.  EXTREMITIES: No cyanosis, clubbing or edema b/l.    NEUROLOGIC: Cranial nerves II through XII are intact. No focal Motor or sensory deficits b/l.   PSYCHIATRIC: The patient is alert and oriented x 3.  SKIN: No obvious rash, lesion, or ulcer.    LABORATORY PANEL:   CBC  Recent Labs Lab 12/17/14 0327  WBC 10.7*  HGB 9.4*  HCT 29.1*  PLT 272    Chemistries   Recent Labs Lab 12/13/14 1059  12/17/14 0327 12/17/14 0926  NA 141  < > 138  --   K 2.7*  < > 2.8*  --   CL 102  < > 101  --   CO2 32  < > 27  --   GLUCOSE 94  < > 124*  --   BUN 15  < > 16  --   CREATININE 0.76  < > 0.82  --   CALCIUM 7.9*  < > 8.1*  --   MG 1.9  --   --   --   AST 16  --   --  21  ALT 6*  --   --  15*  ALKPHOS 58  --   --  62  BILITOT 0.3  --   --  0.6  < > = values in this interval not displayed.  Cardiac Enzymes  Recent Labs Lab 12/17/14 0926  TROPONINI <0.03    RADIOLOGY:  Ct Abdomen Pelvis Wo Contrast  12/17/2014   CLINICAL DATA:  Shortness of breath and  chest pain or 1 week  EXAM: CT CHEST, ABDOMEN AND PELVIS WITHOUT CONTRAST  TECHNIQUE: Multidetector CT imaging of the chest, abdomen and pelvis was performed following the standard protocol without IV contrast.  COMPARISON:  01/27/2014  FINDINGS: CT CHEST FINDINGS  Left lower lobe consolidation with associated large pleural effusion is noted. A small right-sided pleural effusion is seen. No other focal infiltrate is noted. No parenchymal nodule is seen. Pectus deformity is noted. Aortic calcifications are noted. No hilar or mediastinal adenopathy is seen. Coronary calcifications are noted.  CT ABDOMEN AND PELVIS FINDINGS  The liver, gallbladder, right kidney, adrenal glands and pancreas are within normal limits. The spleen  is unremarkable. The left kidney again demonstrates cystic changes similar to that seen on the prior exam. Nonobstructing stone is noted in the lower pole of the left kidney similar to that seen on prior exam.There is however a new 10.7 x 9.8 x 11.5 cm mass lesion arising from the medial aspect of the left kidney posteriorly. It is mixed attenuation within a portion of which may be related to hemorrhage. Perinephric stranding is noted and may represent local invasion. This lesion was identified on the prior PET-CT and was felt to represent a an exophytic cyst at that time. This should be considered as renal cell carcinoma until proven otherwise.  Aortoiliac calcifications are identified without aneurysmal dilatation. The bladder is well distended. No pelvic mass lesion is seen. There are changes consistent with a left inguinal hernia containing loops of large and small bowel although no incarceration is noted. Diverticulosis of the colon is seen. Degenerative changes of the thoracolumbar spine are noted.  IMPRESSION: Left lower lobe consolidation with associated large pleural effusion. Small right-sided pleural effusion is noted.  Large mixed attenuation left renal mass as described. This felt to represent renal cell carcinoma till proven otherwise. Diffuse perinephric stranding is noted. There is also encroachment upon the left psoas muscle.  Left inguinal hernia without incarceration.  Left renal cystic change and nonobstructing renal calculi stable from the previous exam.  These results will be called to the ordering clinician or representative by the Radiologist Assistant, and communication documented in the PACS or zVision Dashboard.   Electronically Signed   By: Inez Catalina M.D.   On: 12/17/2014 11:05   Ct Chest Wo Contrast  12/17/2014   CLINICAL DATA:  Shortness of breath and chest pain or 1 week  EXAM: CT CHEST, ABDOMEN AND PELVIS WITHOUT CONTRAST  TECHNIQUE: Multidetector CT imaging of the chest,  abdomen and pelvis was performed following the standard protocol without IV contrast.  COMPARISON:  01/27/2014  FINDINGS: CT CHEST FINDINGS  Left lower lobe consolidation with associated large pleural effusion is noted. A small right-sided pleural effusion is seen. No other focal infiltrate is noted. No parenchymal nodule is seen. Pectus deformity is noted. Aortic calcifications are noted. No hilar or mediastinal adenopathy is seen. Coronary calcifications are noted.  CT ABDOMEN AND PELVIS FINDINGS  The liver, gallbladder, right kidney, adrenal glands and pancreas are within normal limits. The spleen is unremarkable. The left kidney again demonstrates cystic changes similar to that seen on the prior exam. Nonobstructing stone is noted in the lower pole of the left kidney similar to that seen on prior exam.There is however a new 10.7 x 9.8 x 11.5 cm mass lesion arising from the medial aspect of the left kidney posteriorly. It is mixed attenuation within a portion of which may be related to hemorrhage. Perinephric stranding is noted  and may represent local invasion. This lesion was identified on the prior PET-CT and was felt to represent a an exophytic cyst at that time. This should be considered as renal cell carcinoma until proven otherwise.  Aortoiliac calcifications are identified without aneurysmal dilatation. The bladder is well distended. No pelvic mass lesion is seen. There are changes consistent with a left inguinal hernia containing loops of large and small bowel although no incarceration is noted. Diverticulosis of the colon is seen. Degenerative changes of the thoracolumbar spine are noted.  IMPRESSION: Left lower lobe consolidation with associated large pleural effusion. Small right-sided pleural effusion is noted.  Large mixed attenuation left renal mass as described. This felt to represent renal cell carcinoma till proven otherwise. Diffuse perinephric stranding is noted. There is also encroachment upon  the left psoas muscle.  Left inguinal hernia without incarceration.  Left renal cystic change and nonobstructing renal calculi stable from the previous exam.  These results will be called to the ordering clinician or representative by the Radiologist Assistant, and communication documented in the PACS or zVision Dashboard.   Electronically Signed   By: Inez Catalina M.D.   On: 12/17/2014 11:05   Dg Chest Portable 1 View  12/17/2014   CLINICAL DATA:  Initial evaluation for acute shortness of breath in chest pain for 1 week.  EXAM: PORTABLE CHEST - 1 VIEW  COMPARISON:  Prior radiograph from 11/12/2014  FINDINGS: Cardiomegaly is grossly stable from previous. Mediastinal silhouette within normal limits.  Moderate left pleural effusion present. Associated left basilar opacity may reflect atelectasis and/ or consolidation. Right lung is clear. No pulmonary edema. No right-sided effusion. No pneumothorax.  Severe degenerative changes present at the right shoulder. No acute osseus abnormality.  IMPRESSION: 1. Moderate left pleural effusion with associated left basilar opacity, which may reflect atelectasis or consolidation. 2. Grossly stable cardiomegaly.  No pulmonary edema.   Electronically Signed   By: Jeannine Boga M.D.   On: 12/17/2014 05:06     ASSESSMENT AND PLAN:  81 y.o. male, and nursing home resident, with a known history of Parkinson's disease, paroxysmal atrial fibrillation on warfarin, orthostatic hypotension was brought in by the EMS with nursing home note stating acute respiratory distress with chest pain and low-grade temperature of 99.5. According to the patient's wife who is with the patient this time she received a call from the nursing home staff stating that patient is having shortness of breath with some chest discomfort   1. Acute hypoxic respiratory failure due to large Left Pleural effuions withmild acute on chronic diastolic CHF -IV lasix prn -Thoracentesis once INR is down -INR  >4.0 will give small dose of vitamin K -Elevated WBC of 10.7. Chest x-ray left pleural effusion with associated left basilar opacity reflecting consolidation versus atelectasis.  -O2 supplementation as needed, continue IV Levaquin, albuterol neb as needed.  - echocardiogram done in June 2016 EF 55-60%  2. Left pleural effusion, etiology not clear. Per patient's wife he has been retaining fluid since he has been started on Florinef. Needs further workup for evaluation of pleural effusion. Pulmonary consultation with Dr Mortimer Fries -reommends thoracentesis and CT chest/abdomen  3. History of past medical atrial fibrillation now with rapid regular rate. Patient received IV Cardizem 10 mg in the ED, rate improved. Start by mouth Cardizem monitor heart rate closely. Echocardiogram and cardiology consultation requested. Patient on chronic anticoagulation, INR elevated, hence Coumadin on hold.  4. Episode of chest pain, atypical. Troponin 1 WNL. Plan: Cycle cardiac  enzymes, echo requested. Cardiology consultation for further advice.  5. Hypokalemia. Replace potassium follow-up BMP.  6. Chronic anticoagulation, on Coumadin-on hold because of elevated INR. INR 3.9. Hold Coumadin for now. Follow-up pro time per pharmacy.  7. History of orthostatic hypotension, patient on Florinef. Patient stable. BP in reasonable range. Fall precautions  8. Parkinson's disease, stable on home medications. Continue same.  Case discussed with Care Management/Social Worker. Management plans discussed with the patient, family and they are in agreement.   CODE STATUS: DNR  DVT Prophylaxis: on coumadin  INR 4.5  TOTAL TIME TAKING CARE OF THIS PATIENT: 40 minutes.  >50% time spent on counselling and coordination of care with family  POSSIBLE D/C  DEPENDING ON CLINICAL CONDITION.   Shalae Belmonte M.D on 12/17/2014 at 2:35 PM  Between 7am to 6pm - Pager - [phone]  After 6pm go to [email or web address] - password [email or web address]  [facility]  Office  [phone]  CC: Primary care physician; Lelon Huh, MD

## 2014-12-17 NOTE — Progress Notes (Signed)
ANTIBIOTIC CONSULT NOTE - INITIAL  Pharmacy Consult for Levaquin Indication: pneumonia - HCAP vs CAP  Allergies  Allergen Reactions  . Ivp Dye [Iodinated Diagnostic Agents] Rash  . Latex Rash    Patient Measurements: Height: 5\' 10"  (177.8 cm) Weight: 145 lb (65.772 kg) IBW/kg (Calculated) : 73 Adjusted Body Weight: 65.8 kg  Vital Signs: Temp: 98.2 F (36.8 C) (07/23 0838) Temp Source: Oral (07/23 0838) BP: 153/88 mmHg (07/23 9390) Pulse Rate: 104 (07/23 0701) Intake/Output from previous day: 07/22 0701 - 07/23 0700 In: -  Out: 150 [Urine:150] Intake/Output from this shift: Total I/O In: -  Out: 100 [Urine:100]  Labs:  Recent Labs  12/15/14 0721 12/17/14 0327  WBC  --  10.7*  HGB  --  9.4*  PLT  --  272  CREATININE 0.70 0.82   Estimated Creatinine Clearance: 65.8 mL/min (by C-G formula based on Cr of 0.82). No results for input(s): VANCOTROUGH, VANCOPEAK, VANCORANDOM, GENTTROUGH, GENTPEAK, GENTRANDOM, TOBRATROUGH, TOBRAPEAK, TOBRARND, AMIKACINPEAK, AMIKACINTROU, AMIKACIN in the last 72 hours.   Microbiology: Recent Results (from the past 720 hour(s))  Urine culture     Status: None   Collection Time: 12/14/14  3:24 PM  Result Value Ref Range Status   Specimen Description URINE, CLEAN CATCH  Final   Special Requests NONE  Final   Culture   Final    MULTIPLE SPECIES PRESENT, SUGGEST RECOLLECTION IF CLINICALLY INDICATED   Report Status 12/16/2014 FINAL  Final    Medical History: Past Medical History  Diagnosis Date  . Arthritis   . Hernia   . Atrial fibrillation   . Parkinson disease   . Glaucoma 2005  . Cancer     Basal cell melanoma  . Malignant melanoma of skin of upper limb, including shoulder December 29, 2007    wide excision Clark level IV,, 3 mm deep malignant melanoma,, negative margins, negative sentinel nodes.pT3a,N0  . Syncope and collapse   . Orthostatic hypotension     Medications:  Infusions:   Assessment: 83 yom cc acute  respiratory distress, chest pain, fever, history significant for parkinsons and patient is a resident of a nursing home.   Goal of Therapy:  Defervesence, resolution of infection  Plan:  Expected duration 7 days with resolution of temperature and/or normalization of WBC. Patient received 1 dose of 750 mg IV in ED this morning, will continue 500 mg IV for six more days to treat CAP vs HCAP. Will change IV to PO when clinically appropriate.   Charlann Lange Venora Kautzman 12/17/2014,9:29 AM

## 2014-12-17 NOTE — ED Provider Notes (Signed)
[facility] Emergency Department Provider Note  ____________________________________________  Time seen: Approximately 5:12 AM  I have reviewed the triage vital signs and the nursing notes.   HISTORY  Chief Complaint Shortness of Breath; Chest Pain; and Tachycardia    HPI Randy Mcknight is a 81 y.o. male who comes in with respiratory distress and chest pain. The patient according to his daughter has been having some difficulty this week but started having some shortness of breath this evening. When EMS arrived he was complaining of some mid sternal chest pain and left upper quadrant pain. He was given 2 DuoNeb's and Solu-Medrol as they report he was wheezing and it did seem to improve his symptoms. The patient does have a history of atrial fibrillation. The patient also has a history of Parkinson's disease. When asked the patient reports that his chest pain is improved. The patient's daughter reports that he was recently started on Florinef as well as torsemide and she was concerned that he may be retaining fluid.   Past Medical History  Diagnosis Date  . Arthritis   . Hernia   . Atrial fibrillation   . Parkinson disease   . Glaucoma 2005  . Cancer     Basal cell melanoma  . Malignant melanoma of skin of upper limb, including shoulder December 29, 2007    wide excision Clark level IV,, 3 mm deep malignant melanoma,, negative margins, negative sentinel nodes.pT3a,N0  . Syncope and collapse   . Orthostatic hypotension     Patient Active Problem List   Diagnosis Date Noted  . Acute respiratory distress 12/17/2014  . A-fib 12/17/2014  . Pleural effusion on left 12/17/2014  . Hypokalemia 12/17/2014  . Parkinson's disease 11/30/2014  . Anorexia 11/30/2014  . Syncope 11/10/2014  . Orthostatic hypotension   . Elevated troponin   . Malignant melanoma, metastatic 01/26/2014  . Radial nerve palsy 01/26/2014  . Incarcerated inguinal hernia, unilateral  01/04/2013    Past Surgical History  Procedure Laterality Date  . Lymph node dissection  2009  . Eye surgery Bilateral   . Melanoma excision  2009  . Replacement total knee Right 2005  . Mass excision Right 01-21-14    right upper arm  . Joint replacement      Current Outpatient Rx  Name  Route  Sig  Dispense  Refill  . acetaminophen (TYLENOL) 325 MG tablet   Oral   Take 650 mg by mouth every 4 (four) hours as needed.         . Amino Acids-Protein Hydrolys (FEEDING SUPPLEMENT, PRO-STAT SUGAR FREE 64,) LIQD   Oral   Take 30 mLs by mouth 2 (two) times daily between meals.         . bisacodyl (DULCOLAX) 10 MG suppository   Rectal   Place 10 mg rectally daily as needed for moderate constipation.         . brimonidine (ALPHAGAN P) 0.1 % SOLN   Both Eyes   Place 1 drop into both eyes 2 (two) times daily.         . carbidopa-levodopa (SINEMET IR) 25-100 MG per tablet   Oral   Take 2 tablets by mouth 3 (three) times daily.          Marland Kitchen COUMADIN 3 MG tablet   Oral   Take 3-4 mg by mouth daily. Pt takes 3 mg Sun, Tues, Thurs, Sat and 4 mg on Mon, Wed, Fri         .  donepezil (ARICEPT) 10 MG tablet   Oral   Take 10 mg by mouth at bedtime.         . dorzolamide (TRUSOPT) 2 % ophthalmic solution      1 drop 2 (two) times daily.         Marland Kitchen ENSURE (ENSURE)   Oral   Take 237 mLs by mouth 2 (two) times daily between meals.         . fludrocortisone (FLORINEF) 0.1 MG tablet   Oral   Take 1 tablet (0.1 mg total) by mouth daily.   30 tablet   0   . latanoprost (XALATAN) 0.005 % ophthalmic solution   Both Eyes   Place 1 drop into both eyes at bedtime.         . magnesium hydroxide (MILK OF MAGNESIA) 800 MG/5ML suspension   Oral   Take 30 mLs by mouth daily as needed for constipation.         . midodrine (PROAMATINE) 5 MG tablet   Oral   Take 1 tablet (5 mg total) by mouth 3 (three) times daily as needed (for SBP <110).         . potassium chloride  SA (K-DUR,KLOR-CON) 20 MEQ tablet   Oral   Take 20 mEq by mouth 2 (two) times daily.         Marland Kitchen senna (SENOKOT) 8.6 MG TABS tablet   Oral   Take 1 tablet by mouth 2 (two) times daily.         . sorbitol 70 % solution   Oral   Take 30 mLs by mouth every 6 (six) hours as needed.         . torsemide (DEMADEX) 5 MG tablet   Oral   Take 5 mg by mouth daily.         . vitamin B-12 (CYANOCOBALAMIN) 1000 MCG tablet   Oral   Take 2,000 mcg by mouth daily.           Allergies Ivp dye and Latex  Family History  Problem Relation Age of Onset  . Stroke Mother   . Atrial fibrillation Brother     Social History History  Substance Use Topics  . Smoking status: Never Smoker   . Smokeless tobacco: Never Used  . Alcohol Use: No    Review of Systems Constitutional: No fever/chills Eyes: No visual changes. ENT: No sore throat. Cardiovascular:  chest pain. Respiratory:  shortness of breath. Gastrointestinal: Abdominal pain with.  No nausea, no vomiting.  No diarrhea.  No constipation. Genitourinary: Negative for dysuria. Musculoskeletal: Negative for back pain. Skin: Negative for rash. Neurological: Negative for headaches, focal weakness or numbness.  10-point ROS otherwise negative.  ____________________________________________   PHYSICAL EXAM:  VITAL SIGNS: ED Triage Vitals  Enc Vitals Group     BP 12/17/14 0320 155/78 mmHg     Pulse Rate 12/17/14 0320 108     Resp 12/17/14 0320 20     Temp 12/17/14 0320 97.8 F (36.6 C)     Temp Source 12/17/14 0320 Oral     SpO2 12/17/14 0320 96 %     Weight 12/17/14 0320 145 lb (65.772 kg)     Height 12/17/14 0320 5\' 10"  (1.778 m)     Head Cir --      Peak Flow --      Pain Score 12/17/14 0322 0     Pain Loc --      Pain Edu? --  Excl. in [place]? --     Constitutional: Alert and oriented. Well appearing and in no acute distress. Eyes: Conjunctivae are normal. PERRL. EOMI. Head: Atraumatic. Nose: No  congestion/rhinnorhea. Mouth/Throat: Mucous membranes are moist.  Oropharynx non-erythematous. Cardiovascular: Tachycardia with irregularly irregular rhythm systolic murmur.  Good peripheral circulation. Respiratory: Normal respiratory effort.  No retractions. Lungs CTAB. Gastrointestinal: Soft, LLQ upper quadrant tenderness to palpation. No distention. Positive bowel sounds Genitourinary: Deferred Musculoskeletal: No lower extremity tenderness nor edema.   Neurologic:  Normal speech and language. No gross focal neurologic deficits are appreciated.  Skin:  Skin is warm, dry and intact.  Psychiatric: Mood and affect are normal.   ____________________________________________   LABS (all labs ordered are listed, but only abnormal results are displayed)  Labs Reviewed  BASIC METABOLIC PANEL - Abnormal; Notable for the following:    Potassium 2.8 (*)    Glucose, Bld 124 (*)    Calcium 8.1 (*)    All other components within normal limits  CBC - Abnormal; Notable for the following:    WBC 10.7 (*)    RBC 3.43 (*)    Hemoglobin 9.4 (*)    HCT 29.1 (*)    RDW 15.5 (*)    All other components within normal limits  BRAIN NATRIURETIC PEPTIDE - Abnormal; Notable for the following:    B Natriuretic Peptide 295.0 (*)    All other components within normal limits  CULTURE, BLOOD (ROUTINE X 2)  CULTURE, BLOOD (ROUTINE X 2)  TROPONIN I   ____________________________________________  EKG  ED ECG REPORT I, Loney Hering, the attending physician, personally viewed and interpreted this ECG.   Date: 12/17/2014  EKG Time: 312  Rate: 123  Rhythm: atrial fibrillation, rate 123  Axis: normal  Intervals:none  ST&T Change: none  ____________________________________________  RADIOLOGY  CXR: Moderate left pleural effusion with associated left basilar opacity, may reflect atelectasis or consolidation, grossly stable  cardiomegaly ____________________________________________   PROCEDURES  Procedure(s) performed: None  Critical Care performed: Yes, see critical care note(s)  CRITICAL CARE Performed by: Charlesetta Ivory P   Total critical care time: 45 minutes  Critical care time was exclusive of separately billable procedures and treating other patients.  Critical care was necessary to treat or prevent imminent or life-threatening deterioration.  Critical care was time spent personally by me on the following activities: development of treatment plan with patient and/or surrogate as well as nursing, discussions with consultants, evaluation of patient's response to treatment, examination of patient, obtaining history from patient or surrogate, ordering and performing treatments and interventions, ordering and review of laboratory studies, ordering and review of radiographic studies, pulse oximetry and re-evaluation of patient's condition.  ____________________________________________   INITIAL IMPRESSION / ASSESSMENT AND PLAN / ED COURSE  Pertinent labs & imaging results that were available during my care of the patient were reviewed by me and considered in my medical decision making (see chart for details).  The patient is an 81 year old male who came in with a complaint of shortness of breath. It appears as though the patient has a pleural effusion on his x-ray but it is unknown if there is a pneumonia hiding behind the effusion. I will give the patient is dose of Levaquin to cover him for possible pneumonia. The patient did receive 2 neb treatments with some improvement in his breathing and his dyspnea. The patient was also found to be in A. fib with RVR in the 120s with a max of 141. I  decided to give the patient a dose of diltiazem 10 mg IV 1 dose which did help to slow down the patient's A. fib into an acceptable rate controlled rate. The patient also received some normal saline given his  increased insensible losses from his dyspnea. Given the patient's multiple medical problems, his pleural effusion and his symptoms of dyspnea the patient be admitted to the hospitalist service for further evaluation and treatment of the symptoms. I discussed this with the patient and her father and they agree with the plan as stated. ____________________________________________   FINAL CLINICAL IMPRESSION(S) / ED DIAGNOSES  Final diagnoses:  Atrial fibrillation with rapid ventricular response  Pleural effusion      Loney Hering, MD 12/17/14 [phone]

## 2014-12-17 NOTE — Consult Note (Addendum)
Reason for Consult: possible atrial fib with an RVR  Referring Physician: Dr. Lind Randy Mcknight is an 81 y.o. male.   HPI: The patient is a very pleasant 81 year old man with a history of Parkinson's disease, melanoma, syncope, orthostasis, and fairly severe arthritis. He also is a history of diastolic heart failure. He was hospitalized a month ago with shortness of breath and fluid overload. He was seen by Dr. Darlin Drop 2 weeks ago and medications were adjusted. He is bothered by both syncope and the propensity for volume overload. He has become fairly sedentary with his Parkinson's disease. The patient was admitted to the hospital with shortness of breath and fluid overload. His ventricular rate was increased and there was a question of atrial fibrillation. He has not recently fallen and injured himself.  PMH: Past Medical History  Diagnosis Date  . Arthritis   . Hernia   . Atrial fibrillation   . Parkinson disease   . Glaucoma 2005  . Cancer     Basal cell melanoma  . Malignant melanoma of skin of upper limb, including shoulder December 29, 2007    wide excision Clark level IV,, 3 mm deep malignant melanoma,, negative margins, negative sentinel nodes.pT3a,N0  . Syncope and collapse   . Orthostatic hypotension     PSHX: Past Surgical History  Procedure Laterality Date  . Lymph node dissection  2009  . Eye surgery Bilateral   . Melanoma excision  2009  . Replacement total knee Right 2005  . Mass excision Right 01-21-14    right upper arm  . Joint replacement      FAMHX: Family History  Problem Relation Age of Onset  . Stroke Mother   . Atrial fibrillation Brother     Social History:  reports that he has never smoked. He has never used smokeless tobacco. He reports that he does not drink alcohol or use illicit drugs.  Allergies:  Allergies  Allergen Reactions  . Ivp Dye [Iodinated Diagnostic Agents] Rash  . Latex Rash    Medications: I have reviewed the  patient's current medications.  Ct Abdomen Pelvis Wo Contrast  12/17/2014   CLINICAL DATA:  Shortness of breath and chest pain or 1 week  EXAM: CT CHEST, ABDOMEN AND PELVIS WITHOUT CONTRAST  TECHNIQUE: Multidetector CT imaging of the chest, abdomen and pelvis was performed following the standard protocol without IV contrast.  COMPARISON:  01/27/2014  FINDINGS: CT CHEST FINDINGS  Left lower lobe consolidation with associated large pleural effusion is noted. A small right-sided pleural effusion is seen. No other focal infiltrate is noted. No parenchymal nodule is seen. Pectus deformity is noted. Aortic calcifications are noted. No hilar or mediastinal adenopathy is seen. Coronary calcifications are noted.  CT ABDOMEN AND PELVIS FINDINGS  The liver, gallbladder, right kidney, adrenal glands and pancreas are within normal limits. The spleen is unremarkable. The left kidney again demonstrates cystic changes similar to that seen on the prior exam. Nonobstructing stone is noted in the lower pole of the left kidney similar to that seen on prior exam.There is however a new 10.7 x 9.8 x 11.5 cm mass lesion arising from the medial aspect of the left kidney posteriorly. It is mixed attenuation within a portion of which may be related to hemorrhage. Perinephric stranding is noted and may represent local invasion. This lesion was identified on the prior PET-CT and was felt to represent a an exophytic cyst at that time. This should be considered as renal  cell carcinoma until proven otherwise.  Aortoiliac calcifications are identified without aneurysmal dilatation. The bladder is well distended. No pelvic mass lesion is seen. There are changes consistent with a left inguinal hernia containing loops of large and small bowel although no incarceration is noted. Diverticulosis of the colon is seen. Degenerative changes of the thoracolumbar spine are noted.  IMPRESSION: Left lower lobe consolidation with associated large pleural  effusion. Small right-sided pleural effusion is noted.  Large mixed attenuation left renal mass as described. This felt to represent renal cell carcinoma till proven otherwise. Diffuse perinephric stranding is noted. There is also encroachment upon the left psoas muscle.  Left inguinal hernia without incarceration.  Left renal cystic change and nonobstructing renal calculi stable from the previous exam.  These results will be called to the ordering clinician or representative by the Radiologist Assistant, and communication documented in the PACS or zVision Dashboard.   Electronically Signed   By: Inez Catalina M.D.   On: 12/17/2014 11:05   Ct Chest Wo Contrast  12/17/2014   CLINICAL DATA:  Shortness of breath and chest pain or 1 week  EXAM: CT CHEST, ABDOMEN AND PELVIS WITHOUT CONTRAST  TECHNIQUE: Multidetector CT imaging of the chest, abdomen and pelvis was performed following the standard protocol without IV contrast.  COMPARISON:  01/27/2014  FINDINGS: CT CHEST FINDINGS  Left lower lobe consolidation with associated large pleural effusion is noted. A small right-sided pleural effusion is seen. No other focal infiltrate is noted. No parenchymal nodule is seen. Pectus deformity is noted. Aortic calcifications are noted. No hilar or mediastinal adenopathy is seen. Coronary calcifications are noted.  CT ABDOMEN AND PELVIS FINDINGS  The liver, gallbladder, right kidney, adrenal glands and pancreas are within normal limits. The spleen is unremarkable. The left kidney again demonstrates cystic changes similar to that seen on the prior exam. Nonobstructing stone is noted in the lower pole of the left kidney similar to that seen on prior exam.There is however a new 10.7 x 9.8 x 11.5 cm mass lesion arising from the medial aspect of the left kidney posteriorly. It is mixed attenuation within a portion of which may be related to hemorrhage. Perinephric stranding is noted and may represent local invasion. This lesion was  identified on the prior PET-CT and was felt to represent a an exophytic cyst at that time. This should be considered as renal cell carcinoma until proven otherwise.  Aortoiliac calcifications are identified without aneurysmal dilatation. The bladder is well distended. No pelvic mass lesion is seen. There are changes consistent with a left inguinal hernia containing loops of large and small bowel although no incarceration is noted. Diverticulosis of the colon is seen. Degenerative changes of the thoracolumbar spine are noted.  IMPRESSION: Left lower lobe consolidation with associated large pleural effusion. Small right-sided pleural effusion is noted.  Large mixed attenuation left renal mass as described. This felt to represent renal cell carcinoma till proven otherwise. Diffuse perinephric stranding is noted. There is also encroachment upon the left psoas muscle.  Left inguinal hernia without incarceration.  Left renal cystic change and nonobstructing renal calculi stable from the previous exam.  These results will be called to the ordering clinician or representative by the Radiologist Assistant, and communication documented in the PACS or zVision Dashboard.   Electronically Signed   By: Inez Catalina M.D.   On: 12/17/2014 11:05   Dg Chest Portable 1 View  12/17/2014   CLINICAL DATA:  Initial evaluation for acute shortness of  breath in chest pain for 1 week.  EXAM: PORTABLE CHEST - 1 VIEW  COMPARISON:  Prior radiograph from 11/12/2014  FINDINGS: Cardiomegaly is grossly stable from previous. Mediastinal silhouette within normal limits.  Moderate left pleural effusion present. Associated left basilar opacity may reflect atelectasis and/ or consolidation. Right lung is clear. No pulmonary edema. No right-sided effusion. No pneumothorax.  Severe degenerative changes present at the right shoulder. No acute osseus abnormality.  IMPRESSION: 1. Moderate left pleural effusion with associated left basilar opacity, which  may reflect atelectasis or consolidation. 2. Grossly stable cardiomegaly.  No pulmonary edema.   Electronically Signed   By: Jeannine Boga M.D.   On: 12/17/2014 05:06    ROS  As stated in the HPI and negative for all other systems.  Physical Exam  Vitals:Blood pressure 137/74, pulse 105, temperature 98.1 F (36.7 C), temperature source Oral, resp. rate 16, height 5\' 10"  (1.778 m), weight 145 lb (65.772 kg), SpO2 99 %.  Stable but chronically ill appearing elderly man, NAD HEENT: Unremarkable, he does not have a mask facies. Neck:  7 cm JVD, no thyromegally Back:  No CVA tenderness Lungs:  Clear, except for scattered basilar rales. HEART:  IRegular tachy rhythm, no murmurs, no rubs, no clicks Abd:  Flat, soft, positive bowel sounds, no organomegally, no rebound, no guarding Ext:  2 plus pulses, trace peripheral edema, no cyanosis, no clubbing Skin:  No rashes no nodules Neuro:  CN II through XII intact, motor grossly intact  ECG - noise artifact is present. He could have atrial fibrillation, but more likely he has multifocal atrial tachycardia  Telemetry - appears to have multifocal atrial tachycardia, versus sinus tachycardia with multiple different PA-C morphologies  Assessment/Plan: 1. Possible atrial fibrillation - most likely, the patient's arrhythmias actually multifocal atrial tachycardia versus sinus tachycardia with multiple PACs. His Parkinson's disease and corresponding tremor result in artifact on the ECG and telemetry tracings making accurate discernment of the EKG more difficult. Regardless, the patient is not a candidate for systemic anticoagulation based on his prior falls and episodes of syncope. With regard to control of his ventricular rate, low dose verapamil in conjunction with digoxin would be my first suggestion. 2. Orthostasis/syncope - the patient may be a candidate for the new drug for neurogenic syncope in patient's with Parkinson's disease. 3. Volume  overload - gentle diuresis is indicated  Suella Grove 12/17/2014, 12:33 PM

## 2014-12-17 NOTE — Clinical Social Work Note (Signed)
Clinical Social Work Assessment  Patient Details  Name: Randy Mcknight MRN: [id number] Date of Birth: May 27, 1931  Date of referral:  12/17/14               Reason for consult:  Facility Placement, Other (Comment Required) (From [facility] )                Permission sought to share information with:  Chartered certified accountant granted to share information::  Yes, Verbal Permission Granted  Name::      From [employer]::     Relationship::     Contact Information:     Housing/Transportation Living arrangements for the past 2 months:  [place] of Information:  Spouse Patient Interpreter Needed:  None Criminal Activity/Legal Involvement Pertinent to Current Situation/Hospitalization:  No - Comment as needed Significant Relationships:  Adult Children, Spouse Lives with:  Facility Resident Do you feel safe going back to the place where you live?  Yes Need for family participation in patient care:  Yes (Comment)  Care giving concerns: Patient is a long term care resident at [facility].    Social Worker assessment / plan: Holiday representative (CSW) received consult that patient is from [employer] CSW contacted Colen Darling at [facility] who confirmed that patient is a long term care resident. CSW met with patient and his wife Randy Mcknight [phone] was at bedside. Patient was laying in the bed and did not participate in assessment. Wife reported that patient went to [facility] in June for rehab and transitioned to long term care. Wife is agreeable for patient to return to [facility] when medically stable. CSW will continue to follow and assist as needed.      Employment status:  Retired Nurse, adult PT Recommendations:  Not assessed at this time Information / Referral to community resources:     Patient/Family's Response to care:  Patient's wife is agreeable for patient to return  to [employer]   Patient/Family's Understanding of and Emotional Response to Diagnosis, Current Treatment, and Prognosis:  Wife was pleasant and thanked CSW for visit.   Emotional Assessment Appearance:  Appears stated age Attitude/Demeanor/Rapport:  Unable to Assess Affect (typically observed):  Unable to Assess Orientation:  Fluctuating Orientation (Suspected and/or reported Sundowners) Alcohol / Substance use:  Not Applicable Psych involvement (Current and /or in the community):  No (Comment)  Discharge Needs  Concerns to be addressed:  Discharge Planning Concerns Readmission within the last 30 days:  Yes Current discharge risk:  Chronically ill Barriers to Discharge:  Continued Medical Work up   Elwyn Reach 12/17/2014, 4:56 PM

## 2014-12-17 NOTE — Progress Notes (Signed)
ANTICOAGULATION CONSULT NOTE - Initial Consult  Pharmacy Consult for warfarin Indication: atrial fibrillation  Allergies  Allergen Reactions  . Ivp Dye [Iodinated Diagnostic Agents] Rash  . Latex Rash    Patient Measurements: Height: 5\' 10"  (177.8 cm) Weight: 145 lb (65.772 kg) IBW/kg (Calculated) : 73 Heparin Dosing Weight: 65.8 kg  Vital Signs: Temp: 98.1 F (36.7 C) (07/23 1219) Temp Source: Oral (07/23 1219) BP: 137/74 mmHg (07/23 1219) Pulse Rate: 105 (07/23 1219)  Labs:  Recent Labs  12/15/14 0721 12/16/14 4037 12/17/14 0321 12/17/14 0327 12/17/14 0926  HGB  --   --   --  9.4*  --   HCT  --   --   --  29.1*  --   PLT  --   --   --  272  --   LABPROT 44.3* 38.2* 45.4*  --   --   INR 4.74* 3.90 4.89*  --   --   CREATININE 0.70  --   --  0.82  --   TROPONINI  --   --   --  <0.03 <0.03    Estimated Creatinine Clearance: 65.8 mL/min (by C-G formula based on Cr of 0.82).   Medical History: Past Medical History  Diagnosis Date  . Arthritis   . Hernia   . Atrial fibrillation   . Parkinson disease   . Glaucoma 2005  . Cancer     Basal cell melanoma  . Malignant melanoma of skin of upper limb, including shoulder December 29, 2007    wide excision Clark level IV,, 3 mm deep malignant melanoma,, negative margins, negative sentinel nodes.pT3a,N0  . Syncope and collapse   . Orthostatic hypotension     Medications:  Infusions:    Assessment: 89 yom brought by EMS for acute respiratory distress, chest pain and fever. Takes VKA for AF and per wife has not received a dose for 3 days. INR today 3.9, received diltiazem IV x 1 in ED and will be started on levaquin for pneumonia.  7/22 INR 3.9 7/23 INR 4.89 - INR increased, order for vitamin K 5 mg noted, hgb 9.4 today (9.7 on 7/19)  Goal of Therapy:  INR 2-3    Plan:  Will continue to hold warfarin today. Assess INR in AM and redose if appropriate.   Rayna Sexton L 12/17/2014,1:35 PM

## 2014-12-17 NOTE — ED Notes (Signed)
Admitting MD at bedside.

## 2014-12-17 NOTE — ED Notes (Signed)
Pt to ED via EMS from [facility] place c/o ShOB and CP, pt was administered 2 Duonebs and 125 mg IV solumedrol via EMS.

## 2014-12-17 NOTE — Consult Note (Signed)
   CXR reviewed, recommend US guided thoracentesis however INR>3.  Recommend CT chest without contrast and will re-assess patient after CT chest and US guided thoracentesis performed

## 2014-12-17 NOTE — Progress Notes (Signed)
ANTICOAGULATION CONSULT NOTE - Initial Consult  Pharmacy Consult for warfarin Indication: atrial fibrillation  Allergies  Allergen Reactions  . Ivp Dye [Iodinated Diagnostic Agents] Rash  . Latex Rash    Patient Measurements: Height: 5\' 10"  (177.8 cm) Weight: 145 lb (65.772 kg) IBW/kg (Calculated) : 73 Heparin Dosing Weight: 65.8 kg  Vital Signs: Temp: 98.2 F (36.8 C) (07/23 0838) Temp Source: Oral (07/23 0838) BP: 153/88 mmHg (07/23 0838) Pulse Rate: 104 (07/23 0701)  Labs:  Recent Labs  12/15/14 0721 12/16/14 0852 12/17/14 0327  HGB  --   --  9.4*  HCT  --   --  29.1*  PLT  --   --  272  LABPROT 44.3* 38.2*  --   INR 4.74* 3.90  --   CREATININE 0.70  --  0.82  TROPONINI  --   --  <0.03    Estimated Creatinine Clearance: 65.8 mL/min (by C-G formula based on Cr of 0.82).   Medical History: Past Medical History  Diagnosis Date  . Arthritis   . Hernia   . Atrial fibrillation   . Parkinson disease   . Glaucoma 2005  . Cancer     Basal cell melanoma  . Malignant melanoma of skin of upper limb, including shoulder December 29, 2007    wide excision Clark level IV,, 3 mm deep malignant melanoma,, negative margins, negative sentinel nodes.pT3a,N0  . Syncope and collapse   . Orthostatic hypotension     Medications:  Infusions:    Assessment: 64 yom brought by EMS for acute respiratory distress, chest pain and fever. Takes VKA for AF and per wife has not received a dose for 3 days. INR today 3.9, received diltiazem IV x 1 in ED and will be started on levaquin for pneumonia.  Goal of Therapy:  INR 2-3    Plan:  Will continue to hold warfarin today. Assess INR in AM and redose if appropriate.   Laural Benes 12/17/2014,9:23 AM

## 2014-12-18 LAB — CBC
HCT: 23.9 % — ABNORMAL LOW (ref 40.0–52.0)
Hemoglobin: 7.7 g/dL — ABNORMAL LOW (ref 13.0–18.0)
MCH: 27.4 pg (ref 26.0–34.0)
MCHC: 32.4 g/dL (ref 32.0–36.0)
MCV: 84.6 fL (ref 80.0–100.0)
Platelets: 228 10*3/uL (ref 150–440)
RBC: 2.83 MIL/uL — ABNORMAL LOW (ref 4.40–5.90)
RDW: 16 % — ABNORMAL HIGH (ref 11.5–14.5)
WBC: 13.6 10*3/uL — ABNORMAL HIGH (ref 3.8–10.6)

## 2014-12-18 LAB — BASIC METABOLIC PANEL
Anion gap: 7 (ref 5–15)
BUN: 23 mg/dL — ABNORMAL HIGH (ref 6–20)
CO2: 29 mmol/L (ref 22–32)
Calcium: 8.1 mg/dL — ABNORMAL LOW (ref 8.9–10.3)
Chloride: 103 mmol/L (ref 101–111)
Creatinine, Ser: 0.9 mg/dL (ref 0.61–1.24)
GFR calc Af Amer: >60 mL/min (ref >60)
GFR calc non Af Amer: >60 mL/min (ref >60)
GLUCOSE: 111 mg/dL — AB (ref 65–99)
POTASSIUM: 3.5 mmol/L (ref 3.5–5.1)
SODIUM: 139 mmol/L (ref 135–145)

## 2014-12-18 LAB — VITAMIN D 1,25 DIHYDROXY
VITAMIN D 1, 25 (OH) TOTAL: 29 pg/mL
VITAMIN D3 1, 25 (OH): 29 pg/mL
Vitamin D2 1, 25 (OH)2: <10 pg/mL

## 2014-12-18 LAB — PROTIME-INR
INR: 2.62
PROTHROMBIN TIME: 28.1 s — AB (ref 11.4–15.0)

## 2014-12-18 NOTE — Consult Note (Signed)
Date: 12/18/2014,   MRN# [id number] Randy Mcknight Oct 24, 1930 Code Status:     Code Status Orders        Start     Ordered   12/17/14 0906  Full code   Continuous     12/17/14 0905    Advance Directive Documentation        Most Recent Value   Type of Advance Directive  Living will   Pre-existing out of facility DNR order (yellow form or pink MOST form)     "MOST" Form in Place?            AdmissionWeight: 145 lb (65.772 kg)                 CurrentWeight: 149 lb 4.8 oz (67.722 kg) Randy Mcknight is a 81 y.o. old male seen in consultation for pleural effusion and SOB.     CHIEF COMPLAINT:   SOB   HISTORY OF PRESENT ILLNESS   81 y.o. male, and nursing home resident, with a known history of Parkinson's disease, paroxysmal atrial fibrillation on warfarin, orthostatic hypotension was brought in by the EMS with nursing home note stating acute respiratory distress with chest pain and low-grade temperature of 99.5. EMS gave him Solu-Medrol and DuoNeb en route to the ER. Report of started  on torsemide for chronic pedal edema 3 days ago and since then has been feeling weak, not eating well.   No history of any nausea vomiting. In ER  was noted to have atrial fibrillation with a rapid ventricular rate of 123 bpm, was given IV Cardizem 10 mg following which her heart rate improved and is currently maintaining in the upper 90s. workup revealed chest x-ray with moderate left pleural effusion with associated left basilar opacity reflecting atelectasis versus consolidation. CT chest and abd confirms large left pleural effusion with large left kidney mass as per radiology report  Patient states that he has had some SOB for several weeks, and left scrotal pain for several months    At the current time patient is comfortably resting in the bed, able to follow commands, responding monosyllabically, states feeling better.   PAST MEDICAL HISTORY   Past Medical History  Diagnosis  Date  . Arthritis   . Hernia   . Atrial fibrillation   . Parkinson disease   . Glaucoma 2005  . Cancer     Basal cell melanoma  . Malignant melanoma of skin of upper limb, including shoulder December 29, 2007    wide excision Clark level IV,, 3 mm deep malignant melanoma,, negative margins, negative sentinel nodes.pT3a,N0  . Syncope and collapse   . Orthostatic hypotension      SURGICAL HISTORY   Past Surgical History  Procedure Laterality Date  . Lymph node dissection  2009  . Eye surgery Bilateral   . Melanoma excision  2009  . Replacement total knee Right 2005  . Mass excision Right 01-21-14    right upper arm  . Joint replacement       FAMILY HISTORY   Family History  Problem Relation Age of Onset  . Stroke Mother   . Atrial fibrillation Brother      SOCIAL HISTORY   History  Substance Use Topics  . Smoking status: Never Smoker   . Smokeless tobacco: Never Used  . Alcohol Use: No     MEDICATIONS    Home Medication:  No current outpatient prescriptions on file.  Current Medication:  Current facility-administered medications:  .  acetaminophen (TYLENOL) tablet 650 mg, 650 mg, Oral, Q6H PRN, Juluis Mire, MD .  albuterol (PROVENTIL) (2.5 MG/3ML) 0.083% nebulizer solution 2.5 mg, 2.5 mg, Nebulization, Q2H PRN, Juluis Mire, MD .  bisacodyl (DULCOLAX) suppository 10 mg, 10 mg, Rectal, Daily PRN, Juluis Mire, MD .  brimonidine (ALPHAGAN) 0.15 % ophthalmic solution 1 drop, 1 drop, Both Eyes, BID, Juluis Mire, MD, 1 drop at 12/18/14 1017 .  Carbidopa-Levodopa ER (SINEMET CR) 25-100 MG tablet controlled release 2 tablet, 2 tablet, Oral, TID, Juluis Mire, MD, 2 tablet at 12/18/14 1018 .  donepezil (ARICEPT) tablet 10 mg, 10 mg, Oral, QHS, Juluis Mire, MD, 10 mg at 12/17/14 2231 .  dorzolamide (TRUSOPT) 2 % ophthalmic solution 1 drop, 1 drop, Both Eyes, BID, Juluis Mire, MD, 1 drop at 12/18/14 1018 .  feeding supplement (PRO-STAT  SUGAR FREE 64) liquid 30 mL, 30 mL, Oral, BID BM, Juluis Mire, MD, 30 mL at 12/17/14 1532 .  latanoprost (XALATAN) 0.005 % ophthalmic solution 1 drop, 1 drop, Both Eyes, QHS, Juluis Mire, MD, 1 drop at 12/17/14 2231 .  levofloxacin (LEVAQUIN) IVPB 500 mg, 500 mg, Intravenous, Q24H, Fritzi Mandes, MD, 500 mg at 12/18/14 0629 .  midodrine (PROAMATINE) tablet 5 mg, 5 mg, Oral, TID PRN, Juluis Mire, MD .  ondansetron (ZOFRAN) tablet 4 mg, 4 mg, Oral, Q6H PRN **OR** ondansetron (ZOFRAN) injection 4 mg, 4 mg, Intravenous, Q6H PRN, Juluis Mire, MD .  potassium chloride SA (K-DUR,KLOR-CON) CR tablet 20 mEq, 20 mEq, Oral, BID, Juluis Mire, MD, 20 mEq at 12/18/14 1018 .  senna (SENOKOT) tablet 8.6 mg, 1 tablet, Oral, BID, Juluis Mire, MD, 8.6 mg at 12/18/14 1018 .  sodium chloride 0.9 % injection 3 mL, 3 mL, Intravenous, Q12H, Juluis Mire, MD, 3 mL at 12/17/14 2231 .  vitamin B-12 (CYANOCOBALAMIN) tablet 2,000 mcg, 2,000 mcg, Oral, Daily, Juluis Mire, MD, 2,000 mcg at 12/18/14 1018    ALLERGIES   Ivp dye and Latex     REVIEW OF SYSTEMS   Review of Systems  Constitutional: Positive for malaise/fatigue. Negative for fever, chills, weight loss and diaphoresis.  HENT: Negative for congestion and hearing loss.   Eyes: Negative for blurred vision and double vision.  Respiratory: Positive for shortness of breath. Negative for cough, hemoptysis, sputum production and wheezing.   Cardiovascular: Negative for chest pain, palpitations and orthopnea.  Gastrointestinal: Negative for heartburn, nausea, vomiting, abdominal pain, diarrhea, constipation and blood in stool.  Genitourinary: Negative for dysuria and urgency.       Scrotal pain  Musculoskeletal: Negative for myalgias, back pain and neck pain.  Skin: Negative for rash.  Neurological: Positive for weakness. Negative for dizziness, tingling, tremors and headaches.  Endo/Heme/Allergies: Does not bruise/bleed  easily.  Psychiatric/Behavioral: Negative for depression, suicidal ideas and substance abuse.  All other systems reviewed and are negative.    VS: BP 120/70 mmHg  Pulse 71  Temp(Src) 98.2 F (36.8 C) (Oral)  Resp 20  Ht 5\' 10"  (1.778 m)  Wt 149 lb 4.8 oz (67.722 kg)  BMI 21.42 kg/m2  SpO2 98%     PHYSICAL EXAM  Physical Exam  Constitutional: He is oriented to person, place, and time. He appears well-developed and well-nourished. No distress.  HENT:  Head: Normocephalic and atraumatic.  Mouth/Throat: No oropharyngeal exudate.  Eyes: Conjunctivae and EOM are normal. Pupils are equal, round, and reactive to light. No scleral icterus.  Neck: Normal range of motion. Neck supple.  Cardiovascular: Normal rate, regular rhythm and normal heart sounds.   No murmur heard. Pulmonary/Chest: No stridor. No respiratory distress.  Decreased BS left lung  Abdominal: Soft. Bowel sounds are normal. He exhibits no distension. There is no tenderness. There is no rebound.  Genitourinary:  Enlarged scrotum  Musculoskeletal: Normal range of motion. He exhibits no edema.  Neurological: He is alert and oriented to person, place, and time. He displays normal reflexes. Coordination normal.  Skin: Skin is warm. He is not diaphoretic.  Psychiatric: He has a normal mood and affect.        LABS    Recent Labs     12/16/14  0852  12/17/14  0321  12/17/14  0327  12/18/14  0508  HGB   --    --   9.4*  7.7*  HCT   --    --   29.1*  23.9*  MCV   --    --   85.0  84.6  WBC   --    --   10.7*  13.6*  BUN   --    --   16  23*  CREATININE   --    --   0.82  0.90  GLUCOSE   --    --   124*  111*  CALCIUM   --    --   8.1*  8.1*  INR  3.90  4.89*   --   2.62  ,    No results for input(s): PH in the last 72 hours.  Invalid input(s): PCO2, PO2, BASEEXCESS, BASEDEFICITE, TFT    CULTURE RESULTS   Recent Results (from the past 240 hour(s))  Urine culture     Status: None   Collection Time:  12/14/14  3:24 PM  Result Value Ref Range Status   Specimen Description URINE, CLEAN CATCH  Final   Special Requests NONE  Final   Culture   Final    MULTIPLE SPECIES PRESENT, SUGGEST RECOLLECTION IF CLINICALLY INDICATED   Report Status 12/16/2014 FINAL  Final  Culture, blood (routine x 2)     Status: None (Preliminary result)   Collection Time: 12/17/14  5:51 AM  Result Value Ref Range Status   Specimen Description BLOOD LEFT ARM  Final   Special Requests BOTTLES DRAWN AEROBIC AND ANAEROBIC 5 CC  Final   Culture NO GROWTH < 24 HOURS  Final   Report Status PENDING  Incomplete  Culture, blood (routine x 2)     Status: None (Preliminary result)   Collection Time: 12/17/14  5:56 AM  Result Value Ref Range Status   Specimen Description BLOOD LEFT FOREARM  Final   Special Requests BOTTLES DRAWN AEROBIC AND ANAEROBIC  5 CC  Final   Culture NO GROWTH < 24 HOURS  Final   Report Status PENDING  Incomplete          IMAGING    No results found.       ASSESSMENT/PLAN   81 yo white male seen for acute SOB found to have large left pleural effusion with new found left kidney mass There is high possibility that this may be malignant mass along with malignant pleural effusion  1.recommend US guided thoracentesis once INR normalizes-send for cytology 2.oxygen as needed 3.continue iV abx for now     I have personally obtained a history, examined the patient, evaluated laboratory and independently reviewed imaging results, formulated the assessment and plan  and placed orders.  The Patient requires high complexity decision making for assessment and support, frequent evaluation and titration of therapies, application of advanced monitoring technologies and extensive interpretation of multiple databases. Time spent with patient 45 minutes.  Patient is satisfied with Plan of action and management.    Corrin Parker, M.D. Pul[facility] [place]  Director Intensive Care Unit

## 2014-12-18 NOTE — Progress Notes (Signed)
[place] [facility] [place] NAME: Randy Mcknight    MR#:  [id number]  DATE OF BIRTH:  January 29, 1931  SUBJECTIVE:  Came in with increasing sob from [facility] with chest tightness. Found to have large effusion Feels better today  REVIEW OF SYSTEMS:   Review of Systems  Constitutional: Negative for fever, chills and weight loss.  HENT: Negative for ear discharge, ear pain and nosebleeds.   Eyes: Negative for blurred vision, pain and discharge.  Respiratory: Positive for shortness of breath. Negative for sputum production, wheezing and stridor.   Cardiovascular: Positive for chest pain and leg swelling. Negative for palpitations, orthopnea and PND.  Gastrointestinal: Negative for nausea, vomiting, abdominal pain and diarrhea.  Genitourinary: Negative for urgency and frequency.  Musculoskeletal: Negative for back pain and joint pain.  Neurological: Positive for weakness. Negative for sensory change, speech change and focal weakness.  Psychiatric/Behavioral: Negative for depression. The patient is not nervous/anxious.   All other systems reviewed and are negative.  Tolerating Diet:yes Tolerating PT: yet to eval  DRUG ALLERGIES:   Allergies  Allergen Reactions  . Ivp Dye [Iodinated Diagnostic Agents] Rash  . Latex Rash    VITALS:  Blood pressure 120/70, pulse 71, temperature 98.2 F (36.8 C), temperature source Oral, resp. rate 20, height 5\' 10"  (1.778 m), weight 67.722 kg (149 lb 4.8 oz), SpO2 98 %.  PHYSICAL EXAMINATION:   Physical Exam  GENERAL:  81 y.o.-year-old patient lying in the bed with no acute distress.  EYES: Pupils equal, round, reactive to light and accommodation. No scleral icterus. Extraocular muscles intact.  HEENT: Head atraumatic, normocephalic. Oropharynx and nasopharynx clear.dry olra mucosa  NECK:  Supple, no jugular venous distention. No thyroid enlargement, no tenderness.  LUNGS: decreased breath sounds  bilaterally left >right, no wheezing, rales, rhonchi. No use of accessory muscles of respiration.  CARDIOVASCULAR: S1, S2 normal. No murmurs, rubs, or gallops.  ABDOMEN: Soft, nontender, nondistended. Bowel sounds present. No organomegaly or mass.  EXTREMITIES: No cyanosis, clubbing or edema b/l.    NEUROLOGIC: Cranial nerves II through XII are intact. No focal Motor or sensory deficits b/l.   PSYCHIATRIC: The patient is alert and oriented x 3.  SKIN: No obvious rash, lesion, or ulcer.    LABORATORY PANEL:   CBC  Recent Labs Lab 12/18/14 0508  WBC 13.6*  HGB 7.7*  HCT 23.9*  PLT 228    Chemistries   Recent Labs Lab 12/13/14 1059  12/17/14 0926 12/18/14 0508  NA 141  < >  --  139  K 2.7*  < >  --  3.5  CL 102  < >  --  103  CO2 32  < >  --  29  GLUCOSE 94  < >  --  111*  BUN 15  < >  --  23*  CREATININE 0.76  < >  --  0.90  CALCIUM 7.9*  < >  --  8.1*  MG 1.9  --   --   --   AST 16  --  21  --   ALT 6*  --  15*  --   ALKPHOS 58  --  62  --   BILITOT 0.3  --  0.6  --   < > = values in this interval not displayed.  Cardiac Enzymes  Recent Labs Lab 12/17/14 2107  TROPONINI 0.04*    RADIOLOGY:  Ct Abdomen Pelvis Wo Contrast  12/17/2014   CLINICAL DATA:  Shortness of breath and chest pain or 1 week  EXAM: CT CHEST, ABDOMEN AND PELVIS WITHOUT CONTRAST  TECHNIQUE: Multidetector CT imaging of the chest, abdomen and pelvis was performed following the standard protocol without IV contrast.  COMPARISON:  01/27/2014  FINDINGS: CT CHEST FINDINGS  Left lower lobe consolidation with associated large pleural effusion is noted. A small right-sided pleural effusion is seen. No other focal infiltrate is noted. No parenchymal nodule is seen. Pectus deformity is noted. Aortic calcifications are noted. No hilar or mediastinal adenopathy is seen. Coronary calcifications are noted.  CT ABDOMEN AND PELVIS FINDINGS  The liver, gallbladder, right kidney, adrenal glands and pancreas are  within normal limits. The spleen is unremarkable. The left kidney again demonstrates cystic changes similar to that seen on the prior exam. Nonobstructing stone is noted in the lower pole of the left kidney similar to that seen on prior exam.There is however a new 10.7 x 9.8 x 11.5 cm mass lesion arising from the medial aspect of the left kidney posteriorly. It is mixed attenuation within a portion of which may be related to hemorrhage. Perinephric stranding is noted and may represent local invasion. This lesion was identified on the prior PET-CT and was felt to represent a an exophytic cyst at that time. This should be considered as renal cell carcinoma until proven otherwise.  Aortoiliac calcifications are identified without aneurysmal dilatation. The bladder is well distended. No pelvic mass lesion is seen. There are changes consistent with a left inguinal hernia containing loops of large and small bowel although no incarceration is noted. Diverticulosis of the colon is seen. Degenerative changes of the thoracolumbar spine are noted.  IMPRESSION: Left lower lobe consolidation with associated large pleural effusion. Small right-sided pleural effusion is noted.  Large mixed attenuation left renal mass as described. This felt to represent renal cell carcinoma till proven otherwise. Diffuse perinephric stranding is noted. There is also encroachment upon the left psoas muscle.  Left inguinal hernia without incarceration.  Left renal cystic change and nonobstructing renal calculi stable from the previous exam.  These results will be called to the ordering clinician or representative by the Radiologist Assistant, and communication documented in the PACS or zVision Dashboard.   Electronically Signed   By: Inez Catalina M.D.   On: 12/17/2014 11:05   Ct Chest Wo Contrast  12/17/2014   CLINICAL DATA:  Shortness of breath and chest pain or 1 week  EXAM: CT CHEST, ABDOMEN AND PELVIS WITHOUT CONTRAST  TECHNIQUE:  Multidetector CT imaging of the chest, abdomen and pelvis was performed following the standard protocol without IV contrast.  COMPARISON:  01/27/2014  FINDINGS: CT CHEST FINDINGS  Left lower lobe consolidation with associated large pleural effusion is noted. A small right-sided pleural effusion is seen. No other focal infiltrate is noted. No parenchymal nodule is seen. Pectus deformity is noted. Aortic calcifications are noted. No hilar or mediastinal adenopathy is seen. Coronary calcifications are noted.  CT ABDOMEN AND PELVIS FINDINGS  The liver, gallbladder, right kidney, adrenal glands and pancreas are within normal limits. The spleen is unremarkable. The left kidney again demonstrates cystic changes similar to that seen on the prior exam. Nonobstructing stone is noted in the lower pole of the left kidney similar to that seen on prior exam.There is however a new 10.7 x 9.8 x 11.5 cm mass lesion arising from the medial aspect of the left kidney posteriorly. It is mixed attenuation within a portion of which may be related to hemorrhage.  Perinephric stranding is noted and may represent local invasion. This lesion was identified on the prior PET-CT and was felt to represent a an exophytic cyst at that time. This should be considered as renal cell carcinoma until proven otherwise.  Aortoiliac calcifications are identified without aneurysmal dilatation. The bladder is well distended. No pelvic mass lesion is seen. There are changes consistent with a left inguinal hernia containing loops of large and small bowel although no incarceration is noted. Diverticulosis of the colon is seen. Degenerative changes of the thoracolumbar spine are noted.  IMPRESSION: Left lower lobe consolidation with associated large pleural effusion. Small right-sided pleural effusion is noted.  Large mixed attenuation left renal mass as described. This felt to represent renal cell carcinoma till proven otherwise. Diffuse perinephric stranding is  noted. There is also encroachment upon the left psoas muscle.  Left inguinal hernia without incarceration.  Left renal cystic change and nonobstructing renal calculi stable from the previous exam.  These results will be called to the ordering clinician or representative by the Radiologist Assistant, and communication documented in the PACS or zVision Dashboard.   Electronically Signed   By: Inez Catalina M.D.   On: 12/17/2014 11:05   Dg Chest Portable 1 View  12/17/2014   CLINICAL DATA:  Initial evaluation for acute shortness of breath in chest pain for 1 week.  EXAM: PORTABLE CHEST - 1 VIEW  COMPARISON:  Prior radiograph from 11/12/2014  FINDINGS: Cardiomegaly is grossly stable from previous. Mediastinal silhouette within normal limits.  Moderate left pleural effusion present. Associated left basilar opacity may reflect atelectasis and/ or consolidation. Right lung is clear. No pulmonary edema. No right-sided effusion. No pneumothorax.  Severe degenerative changes present at the right shoulder. No acute osseus abnormality.  IMPRESSION: 1. Moderate left pleural effusion with associated left basilar opacity, which may reflect atelectasis or consolidation. 2. Grossly stable cardiomegaly.  No pulmonary edema.   Electronically Signed   By: Jeannine Boga M.D.   On: 12/17/2014 05:06     ASSESSMENT AND PLAN:  81 y.o. male, and nursing home resident, with a known history of Parkinson's disease, paroxysmal atrial fibrillation on warfarin, orthostatic hypotension was brought in by the EMS with nursing home note stating acute respiratory distress with chest pain and low-grade temperature of 99.5. According to the patient's wife who is with the patient this time she received a call from the nursing home staff stating that patient is having shortness of breath with some chest discomfort   1. Acute hypoxic respiratory failure due to large Left Pleural effuions withmild acute on chronic diastolic CHF -IV lasix  prn -Thoracentesis once INR is down -INR >4.0 will give small dose of vitamin K -Elevated WBC of 10.7. Chest x-ray left pleural effusion with associated left basilar opacity reflecting consolidation versus atelectasis.  -O2 supplementation as needed, continue IV Levaquin, albuterol neb as needed.  - echocardiogram done in June 2016 EF 55-60%  2.left renal mass with possible hemorrhagic component in it given elevated INR -per wife pt has had a left real cyst found in 2009 ad had eval done which did not require further w/u Now has a large mass left rea which could be malignant mass with malignant pleural effusion vs hemorrahgic cyst -urology consult awaited -spoke with family and wife thinks pt will not be able to withstand any aggressive w/u if this is renal cell cancer  3. History of past medical atrial fibrillation now with rapid regular rate. Patient received IV Cardizem 10  mg in the ED, rate improved. po Cardizem monitor heart rate closely.  -appreciate cardiology consultation  - Patient on chronic anticoagulation, INR elevated, hence Coumadin on hold.  4. Hypokalemia. Replace potassium follow-up BMP.  5. Chronic anticoagulation, on Coumadin-on hold because of elevated INR. INR 3.9. Hold Coumadin for now -INR 4.8-->2.2  6. History of orthostatic hypotension, patient off Florinef sincePatient stable. BP in reasonable range. Fall precautions  7. Parkinson's disease, stable on home medications. Continue same.  Case discussed with Care Management/Social Worker. Management plans discussed with the patient, family and they are in agreement.   CODE STATUS: DNR  DVT Prophylaxis: on coumadin  INR 4.5  TOTAL TIME TAKING CARE OF THIS PATIENT: 40 minutes.  >50% time spent on counselling and coordination of care with family  POSSIBLE D/C  DEPENDING ON CLINICAL CONDITION.   Jeryn Bertoni M.D on 12/18/2014 at 11:12 AM  Between 7am to 6pm - Pager - [phone]  After 6pm go to  [email or web address] - password [email or web address] [facility]  Office  [phone]  CC: Primary care physician; Lelon Huh, MD

## 2014-12-18 NOTE — Progress Notes (Signed)
ANTICOAGULATION CONSULT NOTE - Follow up Randy Mcknight for warfarin Indication: atrial fibrillation  Allergies  Allergen Reactions  . Ivp Dye [Iodinated Diagnostic Agents] Rash  . Latex Rash    Patient Measurements: Height: 5\' 10"  (177.8 cm) Weight: 149 lb 4.8 oz (67.722 kg) IBW/kg (Calculated) : 73   Vital Signs: Temp: 98 F (36.7 C) (07/24 1130) Temp Source: Oral (07/24 1130) BP: 129/73 mmHg (07/24 1130) Pulse Rate: 80 (07/24 1130)  Labs:  Recent Labs  12/16/14 0814 12/17/14 0321  12/17/14 0327 12/17/14 0926 12/17/14 1448 12/17/14 2107 12/18/14 0508  HGB  --   --   --  9.4*  --   --   --  7.7*  HCT  --   --   --  29.1*  --   --   --  23.9*  PLT  --   --   --  272  --   --   --  228  LABPROT 38.2* 45.4*  --   --   --   --   --  28.1*  INR 3.90 4.89*  --   --   --   --   --  2.62  CREATININE  --   --   --  0.82  --   --   --  0.90  TROPONINI  --   --   < > <0.03 <0.03 0.03 0.04*  --   < > = values in this interval not displayed.  Estimated Creatinine Clearance: 61.6 mL/min (by C-G formula based on Cr of 0.9).   Medical History: Past Medical History  Diagnosis Date  . Arthritis   . Hernia   . Atrial fibrillation   . Parkinson disease   . Glaucoma 2005  . Cancer     Basal cell melanoma  . Malignant melanoma of skin of upper limb, including shoulder December 29, 2007    wide excision Clark level IV,, 3 mm deep malignant melanoma,, negative margins, negative sentinel nodes.pT3a,N0  . Syncope and collapse   . Orthostatic hypotension     Medications:  Infusions:    Assessment: 20 yom brought by EMS for acute respiratory distress, chest pain and fever. Takes VKA for AF and per wife has not received a dose for 3 days. INR today 3.9, received diltiazem IV x 1 in ED and will be started on levaquin for pneumonia.  7/22 INR 3.9 7/23 INR 4.89 - INR increased, order for vitamin K 5 mg noted, hgb 9.4 today (9.7 on 7/19) 7/24 INR 2.62 - no  warfarin  Goal of Therapy:  INR 2-3    Plan:  Will continue to hold warfarin today. Confirmed with MD, holding warfarin for INR to come down for thoracentesis. Follow up plan in AM. INR already ordered.    Randy Mcknight 12/18/2014,12:44 PM

## 2014-12-18 NOTE — Progress Notes (Signed)
Dr Reece Levy notified of Troponin level.  No further orders at this time.

## 2014-12-18 NOTE — Progress Notes (Signed)
Pt rathe r subdued today. slepping quite a bit. Remains a/o. Wife at bedside. Made a dnr. sats 99% on 2 l n.c. Tele sinus arrythmia . inr 2.4  Plans for thorocentesis tomorrow.

## 2014-12-19 ENCOUNTER — Inpatient Hospital Stay: Payer: Medicare Other

## 2014-12-19 ENCOUNTER — Other Ambulatory Visit: Payer: Self-pay | Admitting: Interventional Radiology

## 2014-12-19 ENCOUNTER — Encounter: Payer: Self-pay | Admitting: Physician Assistant

## 2014-12-19 DIAGNOSIS — I4891 Unspecified atrial fibrillation: Secondary | ICD-10-CM

## 2014-12-19 DIAGNOSIS — J90 Pleural effusion, not elsewhere classified: Secondary | ICD-10-CM

## 2014-12-19 DIAGNOSIS — N2889 Other specified disorders of kidney and ureter: Secondary | ICD-10-CM

## 2014-12-19 LAB — BASIC METABOLIC PANEL
ANION GAP: 6 (ref 5–15)
BUN: 30 mg/dL — ABNORMAL HIGH (ref 6–20)
CALCIUM: 7.9 mg/dL — AB (ref 8.9–10.3)
CO2: 26 mmol/L (ref 22–32)
Chloride: 104 mmol/L (ref 101–111)
Creatinine, Ser: 0.98 mg/dL (ref 0.61–1.24)
GFR calc non Af Amer: >60 mL/min (ref >60)
GLUCOSE: 86 mg/dL (ref 65–99)
POTASSIUM: 3.6 mmol/L (ref 3.5–5.1)
Sodium: 136 mmol/L (ref 135–145)

## 2014-12-19 LAB — PROTEIN, BODY FLUID: TOTAL PROTEIN, FLUID: 3.1 g/dL

## 2014-12-19 LAB — HEMOGLOBIN: HEMOGLOBIN: 7.8 g/dL — AB (ref 13.0–18.0)

## 2014-12-19 LAB — BODY FLUID CELL COUNT WITH DIFFERENTIAL
Eos, Fluid: 0 %
Lymphs, Fluid: 20 %
Monocyte-Macrophage-Serous Fluid: 13 %
Neutrophil Count, Fluid: 67 %
Other Cells, Fluid: 0 %
Total Nucleated Cell Count, Fluid: 194 cu mm

## 2014-12-19 LAB — GLUCOSE, SEROUS FLUID: Glucose, Fluid: 86 mg/dL

## 2014-12-19 LAB — LACTATE DEHYDROGENASE, PLEURAL OR PERITONEAL FLUID: LD, Fluid: 270 U/L — ABNORMAL HIGH (ref 3–23)

## 2014-12-19 LAB — PROTIME-INR
INR: 1.49
Prothrombin Time: 18.2 seconds — ABNORMAL HIGH (ref 11.4–15.0)

## 2014-12-19 MED ORDER — SODIUM CHLORIDE 0.9 % IJ SOLN: 3.0000 mL | INTRAMUSCULAR | Status: DC | PRN

## 2014-12-19 MED ORDER — LEVOFLOXACIN 500 MG PO TABS: 500.0000 mg | ORAL_TABLET | Freq: Every day | ORAL | Status: DC

## 2014-12-19 NOTE — Care Management Important Message (Signed)
Important Message  Patient Details  Name: Randy Mcknight MRN: [id number] Date of Birth: 04/04/1931   Medicare Important Message Given:  Yes-second notification given    Juliann Pulse A Allmond 12/19/2014, 11:50 AM

## 2014-12-19 NOTE — Progress Notes (Signed)
Pt went for thorocentesis this afternoon. 850 ml withdrawn. Pt returned to room. Tolerated procedure well. Eating well. Med with ty. Tele slenol for local discomfort. Pt more alert and active. vss tele sr with pac'[s in 80's

## 2014-12-19 NOTE — Progress Notes (Addendum)
Patient: Randy Mcknight / Admit Date: 12/17/2014 / Date of Encounter: 12/19/2014, 8:07 AM   Subjective: Mr. Schleifer states he feels much better today and is not currently experiencing any chest pain or shortness of breath, but is having some left flank pain.  He has a continued dry cough that wakes him up at night, but feels his leg swelling is much improved. Has not been OOB except to use potty chair. With that he was quite weak. He is minus 293 for the admission. He is for thoracentesis today. INR 1.4 this AM s/p vitamin K.   Review of Systems: Review of Systems  Constitutional: Positive for malaise/fatigue. Negative for fever, chills, weight loss and diaphoresis.       20 pound weight gain, up from normal weight of 134-136 lb.  HENT: Negative for congestion.   Eyes: Negative for blurred vision, discharge and redness.  Respiratory: Positive for cough and shortness of breath. Negative for sputum production.        Non-productive cough  Cardiovascular: Positive for orthopnea and leg swelling. Negative for chest pain, palpitations and PND.  Gastrointestinal: Negative for nausea and vomiting.  Musculoskeletal: Negative for falls.       Left flank pain  Skin: Negative for itching and rash.  Neurological: Positive for tremors and weakness. Negative for dizziness and loss of consciousness.  Psychiatric/Behavioral: The patient is not nervous/anxious.   All other systems reviewed and are negative.   Objective: Telemetry: NSR, low 80's to 90 BPM, PACs, PVCs, 14 beats NSVT Physical Exam: Blood pressure 137/72, pulse 79, temperature 98.3 F (36.8 C), temperature source Oral, resp. rate 22, height 5\' 10"  (1.778 m), weight 148 lb 8 oz (67.359 kg), SpO2 98 %. Body mass index is 21.31 kg/(m^2). General: Frail gentleman, in no acute distress, laying in bed with wife at bedside, using O2 via nasal cannula Head: Normocephalic, atraumatic, sclera non-icteric, no xanthomas, nares are without  discharge. Neck: Negative for carotid bruits. JVP not elevated. Lungs: Decreased breath sounds on left without wheezes, rales, or rhonchi. Breathing is unlabored. Heart: RRR S1 S2 without murmurs, rubs, or gallops.  Abdomen: Soft, non-tender, non-distended with normoactive bowel sounds. No rebound/guarding. Scrotal swelling.  Extremities: No clubbing or cyanosis. Edema confined to ankles bilaterally. Distal pedal pulses are 2+ and equal bilaterally. Neuro: Alert and oriented X 3. Moves all extremities spontaneously. Psych: Responds to questions when prompted by wife, with a normal affect.   Intake/Output Summary (Last 24 hours) at 12/19/14 0807 Last data filed at 12/19/14 2409  Gross per 24 hour  Intake    943 ml  Output    650 ml  Net    293 ml    Inpatient Medications:  . brimonidine  1 drop Both Eyes BID  . Carbidopa-Levodopa ER  2 tablet Oral TID  . donepezil  10 mg Oral QHS  . dorzolamide  1 drop Both Eyes BID  . feeding supplement (PRO-STAT SUGAR FREE 64)  30 mL Oral BID BM  . latanoprost  1 drop Both Eyes QHS  . levofloxacin (LEVAQUIN) IV  500 mg Intravenous Q24H  . potassium chloride SA  20 mEq Oral BID  . senna  1 tablet Oral BID  . sodium chloride  3 mL Intravenous Q12H  . vitamin B-12  2,000 mcg Oral Daily   Infusions:    Labs:  Recent Labs  12/17/14 0327 12/18/14 0508  NA 138 139  K 2.8* 3.5  CL 101 103  CO2  27 29  GLUCOSE 124* 111*  BUN 16 23*  CREATININE 0.82 0.90  CALCIUM 8.1* 8.1*    Recent Labs  12/17/14 0926  AST 21  ALT 15*  ALKPHOS 62  BILITOT 0.6  PROT 6.5  ALBUMIN 2.7*    Recent Labs  12/17/14 0327 12/18/14 0508  WBC 10.7* 13.6*  HGB 9.4* 7.7*  HCT 29.1* 23.9*  MCV 85.0 84.6  PLT 272 228    Recent Labs  12/17/14 0327 12/17/14 0926 12/17/14 1448 12/17/14 2107  TROPONINI <0.03 <0.03 0.03 0.04*   Invalid input(s): POCBNP No results for input(s): HGBA1C in the last 72 hours.   Weights: Filed Weights   12/17/14 0320  12/18/14 0537 12/19/14 0515  Weight: 145 lb (65.772 kg) 149 lb 4.8 oz (67.722 kg) 148 lb 8 oz (67.359 kg)     Radiology/Studies:  Ct Abdomen Pelvis Wo Contrast  12/17/2014   CLINICAL DATA:  Shortness of breath and chest pain or 1 week  EXAM: CT CHEST, ABDOMEN AND PELVIS WITHOUT CONTRAST  TECHNIQUE: Multidetector CT imaging of the chest, abdomen and pelvis was performed following the standard protocol without IV contrast.  COMPARISON:  01/27/2014  FINDINGS: CT CHEST FINDINGS  Left lower lobe consolidation with associated large pleural effusion is noted. A small right-sided pleural effusion is seen. No other focal infiltrate is noted. No parenchymal nodule is seen. Pectus deformity is noted. Aortic calcifications are noted. No hilar or mediastinal adenopathy is seen. Coronary calcifications are noted.  CT ABDOMEN AND PELVIS FINDINGS  The liver, gallbladder, right kidney, adrenal glands and pancreas are within normal limits. The spleen is unremarkable. The left kidney again demonstrates cystic changes similar to that seen on the prior exam. Nonobstructing stone is noted in the lower pole of the left kidney similar to that seen on prior exam.There is however a new 10.7 x 9.8 x 11.5 cm mass lesion arising from the medial aspect of the left kidney posteriorly. It is mixed attenuation within a portion of which may be related to hemorrhage. Perinephric stranding is noted and may represent local invasion. This lesion was identified on the prior PET-CT and was felt to represent a an exophytic cyst at that time. This should be considered as renal cell carcinoma until proven otherwise.  Aortoiliac calcifications are identified without aneurysmal dilatation. The bladder is well distended. No pelvic mass lesion is seen. There are changes consistent with a left inguinal hernia containing loops of large and small bowel although no incarceration is noted. Diverticulosis of the colon is seen. Degenerative changes of the  thoracolumbar spine are noted.  IMPRESSION: Left lower lobe consolidation with associated large pleural effusion. Small right-sided pleural effusion is noted.  Large mixed attenuation left renal mass as described. This felt to represent renal cell carcinoma till proven otherwise. Diffuse perinephric stranding is noted. There is also encroachment upon the left psoas muscle.  Left inguinal hernia without incarceration.  Left renal cystic change and nonobstructing renal calculi stable from the previous exam.  These results will be called to the ordering clinician or representative by the Radiologist Assistant, and communication documented in the PACS or zVision Dashboard.   Electronically Signed   By: Inez Catalina M.D.   On: 12/17/2014 11:05   Ct Chest Wo Contrast  12/17/2014   CLINICAL DATA:  Shortness of breath and chest pain or 1 week  EXAM: CT CHEST, ABDOMEN AND PELVIS WITHOUT CONTRAST  TECHNIQUE: Multidetector CT imaging of the chest, abdomen and pelvis was performed following  the standard protocol without IV contrast.  COMPARISON:  01/27/2014  FINDINGS: CT CHEST FINDINGS  Left lower lobe consolidation with associated large pleural effusion is noted. A small right-sided pleural effusion is seen. No other focal infiltrate is noted. No parenchymal nodule is seen. Pectus deformity is noted. Aortic calcifications are noted. No hilar or mediastinal adenopathy is seen. Coronary calcifications are noted.  CT ABDOMEN AND PELVIS FINDINGS  The liver, gallbladder, right kidney, adrenal glands and pancreas are within normal limits. The spleen is unremarkable. The left kidney again demonstrates cystic changes similar to that seen on the prior exam. Nonobstructing stone is noted in the lower pole of the left kidney similar to that seen on prior exam.There is however a new 10.7 x 9.8 x 11.5 cm mass lesion arising from the medial aspect of the left kidney posteriorly. It is mixed attenuation within a portion of which may be  related to hemorrhage. Perinephric stranding is noted and may represent local invasion. This lesion was identified on the prior PET-CT and was felt to represent a an exophytic cyst at that time. This should be considered as renal cell carcinoma until proven otherwise.  Aortoiliac calcifications are identified without aneurysmal dilatation. The bladder is well distended. No pelvic mass lesion is seen. There are changes consistent with a left inguinal hernia containing loops of large and small bowel although no incarceration is noted. Diverticulosis of the colon is seen. Degenerative changes of the thoracolumbar spine are noted.  IMPRESSION: Left lower lobe consolidation with associated large pleural effusion. Small right-sided pleural effusion is noted.  Large mixed attenuation left renal mass as described. This felt to represent renal cell carcinoma till proven otherwise. Diffuse perinephric stranding is noted. There is also encroachment upon the left psoas muscle.  Left inguinal hernia without incarceration.  Left renal cystic change and nonobstructing renal calculi stable from the previous exam.  These results will be called to the ordering clinician or representative by the Radiologist Assistant, and communication documented in the PACS or zVision Dashboard.   Electronically Signed   By: Inez Catalina M.D.   On: 12/17/2014 11:05   Dg Chest Portable 1 View  12/17/2014   CLINICAL DATA:  Initial evaluation for acute shortness of breath in chest pain for 1 week.  EXAM: PORTABLE CHEST - 1 VIEW  COMPARISON:  Prior radiograph from 11/12/2014  FINDINGS: Cardiomegaly is grossly stable from previous. Mediastinal silhouette within normal limits.  Moderate left pleural effusion present. Associated left basilar opacity may reflect atelectasis and/ or consolidation. Right lung is clear. No pulmonary edema. No right-sided effusion. No pneumothorax.  Severe degenerative changes present at the right shoulder. No acute osseus  abnormality.  IMPRESSION: 1. Moderate left pleural effusion with associated left basilar opacity, which may reflect atelectasis or consolidation. 2. Grossly stable cardiomegaly.  No pulmonary edema.   Electronically Signed   By: Jeannine Boga M.D.   On: 12/17/2014 05:06     Assessment and Plan  The patient is a very pleasant 81 year old man with a history of paroxysmal atrial fibrillation on warfarin held since 7/20 secondary to supratherapeutic INR of 3.9, chronic diastolic CHF, Parkinson's disease, BCC, melanoma, syncope, orthostasis with a fall in mid-June, and fairly severe arthritis. He was hospitalized a month ago with shortness of breath and fluid overload. He was seen by Dr. Darlin Drop 2 weeks ago and medications were adjusted. He is bothered by both syncope and the propensity for volume overload. He has become fairly sedentary with his  Parkinson's disease. The patient was admitted to the hospital with shortness of breath and fluid overload on 7/23. His ventricular rate was increased and there was a question of atrial fibrillation vs MAT.   1. Possible atrial fibrillation vs MAT:  -hx of paroxysmal atrial fibrillation on warfarin, presenting with supratherapeutic INR -currently in sinus rhythm at 80-90 BPM with PACS, PVCsm and 14 beats of NSVT -his Parkinson's disease and corresponding tremor result in artifact on the ECG and telemetry tracings making accurate discernment of the EKG more difficult -taking coumadin at time of admission: INR 2.62 on 7/24 s/p vitamin K on 7/23; INR on 7/25 1.49 -continue rate control  -patient and his wife would like to consider possibly discontinuing warfarin moving forward pending thoracentesis results. They have not made a final decision at this time, but are aware of increased stroke risk.  -CHADSVASc at least 3 giving him an estimated annual stroke risk of 3.2%. This was discussed with the patient and his wife in detail   2. Large left sided pleural  effusion: -CT chest showed large mixed attenuation left renal mass felt to represent renal cell carcinoma  -Consider malignant pleural effusion given left sided etiology   -found on x-ray on 7/23, confirmed by CT -scheduled for US guided thoracentesis on 7/25  3. Orthostasis/syncope: -had a fall in mid-June due to orthostatsis -started on Florinef bid at that time with no falls since -has not been out of bed since admission,would benefit from working with PT during this admission  4. Acute on chronic diastolic CHF/volume overload: -per wife: has gained 20 pounds in the last month, up from his baseline weight of 134-136 lb.; currently 148.8 lb on 7/25 -gentle diuresis was performed this admission: torsemide 5 mg was ordered on 7/23  -down 287 mL since admission  5. Hypokalemia: -Potassium 2.8 on 7.23 -repleted, potassium 3.5 on 7/24 -BMP orders pended on 7/25 for daily recurrence   Signed, Christell Faith, PA-C Pager: [phone] 12/19/2014, 8:07 AM     I have seen and examined the patient. I agree with the above note .   Kathlyn Sacramento MD, [facility] 12/19/2014 6:18 PM

## 2014-12-19 NOTE — Progress Notes (Signed)
ANTIBIOTIC CONSULT NOTE - INITIAL  Pharmacy Consult for Levaquin Indication: pneumonia  Allergies  Allergen Reactions  . Ivp Dye [Iodinated Diagnostic Agents] Rash  . Latex Rash    Patient Measurements: Height: 5\' 10"  (177.8 cm) Weight: 148 lb 8 oz (67.359 kg) IBW/kg (Calculated) : 73 Adjusted Body Weight: 65.8 kg  Vital Signs: Temp: 97.9 F (36.6 C) (07/25 1224) Temp Source: Oral (07/25 1224) BP: 155/67 mmHg (07/25 1441) Pulse Rate: 78 (07/25 1224) Intake/Output from previous day: 07/24 0701 - 07/25 0700 In: 943 [P.O.:840; I.V.:3; IV Piggyback:100] Out: 650 [Urine:650] Intake/Output from this shift: Total I/O In: -  Out: 200 [Urine:200]  Labs:  Recent Labs  12/17/14 0327 12/18/14 0508 12/19/14 0502  WBC 10.7* 13.6*  --   HGB 9.4* 7.7* 7.8*  PLT 272 228  --   CREATININE 0.82 0.90 0.98   Estimated Creatinine Clearance: 56.4 mL/min (by C-G formula based on Cr of 0.98). No results for input(s): VANCOTROUGH, VANCOPEAK, VANCORANDOM, GENTTROUGH, GENTPEAK, GENTRANDOM, TOBRATROUGH, TOBRAPEAK, TOBRARND, AMIKACINPEAK, AMIKACINTROU, AMIKACIN in the last 72 hours.   Microbiology: Recent Results (from the past 720 hour(s))  Urine culture     Status: None   Collection Time: 12/14/14  3:24 PM  Result Value Ref Range Status   Specimen Description URINE, CLEAN CATCH  Final   Special Requests NONE  Final   Culture   Final    MULTIPLE SPECIES PRESENT, SUGGEST RECOLLECTION IF CLINICALLY INDICATED   Report Status 12/16/2014 FINAL  Final  Culture, blood (routine x 2)     Status: None (Preliminary result)   Collection Time: 12/17/14  5:51 AM  Result Value Ref Range Status   Specimen Description BLOOD LEFT ARM  Final   Special Requests BOTTLES DRAWN AEROBIC AND ANAEROBIC 5 CC  Final   Culture NO GROWTH 2 DAYS  Final   Report Status PENDING  Incomplete  Culture, blood (routine x 2)     Status: None (Preliminary result)   Collection Time: 12/17/14  5:56 AM  Result Value Ref  Range Status   Specimen Description BLOOD LEFT FOREARM  Final   Special Requests BOTTLES DRAWN AEROBIC AND ANAEROBIC  5 CC  Final   Culture NO GROWTH 2 DAYS  Final   Report Status PENDING  Incomplete    Medical History: Past Medical History  Diagnosis Date  . Arthritis   . Hernia   . PAF (paroxysmal atrial fibrillation)     a. on warfarin  . Parkinson disease   . Glaucoma 2005  . Cancer     Basal cell melanoma  . Malignant melanoma of skin of upper limb, including shoulder December 29, 2007    wide excision Clark level IV,, 3 mm deep malignant melanoma,, negative margins, negative sentinel nodes.pT3a,N0  . Syncope and collapse   . Orthostatic hypotension   . Chronic diastolic HF (heart failure)     Assessment: 83 yom cc acute respiratory distress found to have large pleural effusion, chest pain, fever, history significant for parkinsons and patient is a resident of a nursing home.   On day 3 of levaquin  Goal of Therapy:  Defervesence, resolution of infection  Plan:  Current orders for levaquin 500mg  PO Q24H.  Patient with large pleural effusion, planned for thoracentesis today. Will continue current orders for now, MD to evaluate the need for continued antibiotics after procedure.   Rexene Edison, PharmD Clinical Pharmacist  12/19/2014,3:15 PM

## 2014-12-19 NOTE — Consult Note (Addendum)
Subjective: I was asked to see Randy Mcknight in consultation by Dr. Posey Pronto for a 10cm left renal hematoma.  The patient was admitted for respiratory issues and a CT CAP was done which demonstrated a large right pleural effusion along with a large left renal mass with associated hemorrhage.  Randy Mcknight has been on Warfarin and Randy Mcknight INR was up to 4.  Randy Mcknight hgb was 7.7 yesterday and is 7.8 today and Randy Mcknight Cr is 0.98.   Randy Mcknight has minimal if any pain and has had no hematuria.   Randy Mcknight has a history of renal cysts and apparently was evaluated at [facility] for those in 2009.   I have reviewed Randy Mcknight CT's from 2012 and this admission along with a PET scan from 2013 that was done for Randy Mcknight history of melanoma.   The CT in 2012 showed a large left anterior renal cyst, a large peripelvic cyst and a smaller approximately 2cm cyst posterior-medially in the location of the current mass.   On the PET in 2013 there was a new lesion about 1.5cm in size that is posterior medial to the left kidney and maybe exophytic from the kidney or just adjacent to the kidney. It was not noted on the original report but was commented on by Dr. Golden Circle on the current study.  While I don't look at many PET scans, it appears there might be some metabolic activity with the new lesion.  The current CT shows a  10cm mass in the posterior medial left kidney.  Randy Mcknight has a history of a melanoma with metastasis to the bone in the right humerus.  ROS:  Review of Systems  Constitutional: Negative for fever and chills.  Respiratory: Positive for shortness of breath.   Cardiovascular: Positive for leg swelling. Negative for chest pain.  Gastrointestinal: Negative for abdominal pain.  Genitourinary: Negative for flank pain.  Neurological:       Randy Mcknight has rigidity with parkinsons  All other systems reviewed and are negative.   Allergies  Allergen Reactions  . Ivp Dye [Iodinated Diagnostic Agents] Rash  . Latex Rash    Past Medical History  Diagnosis Date  . Arthritis    . Hernia   . PAF (paroxysmal atrial fibrillation)     a. on warfarin  . Parkinson disease   . Glaucoma 2005  . Cancer     Basal cell melanoma  . Malignant melanoma of skin of upper limb, including shoulder December 29, 2007    wide excision Clark level IV,, 3 mm deep malignant melanoma,, negative margins, negative sentinel nodes.pT3a,N0  . Syncope and collapse   . Orthostatic hypotension   . Chronic diastolic HF (heart failure)     Past Surgical History  Procedure Laterality Date  . Lymph node dissection  2009  . Eye surgery Bilateral   . Melanoma excision  2009  . Replacement total knee Right 2005  . Mass excision Right 01-21-14    right upper arm  . Joint replacement      History   Social History  . Marital Status: Married    Spouse Name: N/A  . Number of Children: N/A  . Years of Education: N/A   Occupational History  . Not on file.   Social History Main Topics  . Smoking status: Never Smoker   . Smokeless tobacco: Never Used  . Alcohol Use: No  . Drug Use: No  . Sexual Activity: Not on file   Other Topics Concern  . Not on  file   Social History Narrative    Family History  Problem Relation Age of Onset  . Stroke Mother   . Atrial fibrillation Brother    PSFH reviewed.  Anti-infectives: Anti-infectives    Start     Dose/Rate Route Frequency Ordered Stop   12/20/14 1000  levofloxacin (LEVAQUIN) tablet 500 mg     500 mg Oral Daily 12/19/14 0941     12/18/14 0600  levofloxacin (LEVAQUIN) IVPB 500 mg  Status:  Discontinued     500 mg 100 mL/hr over 60 Minutes Intravenous Every 24 hours 12/17/14 0932 12/19/14 0941   12/17/14 0545  levofloxacin (LEVAQUIN) IVPB 750 mg     750 mg 100 mL/hr over 90 Minutes Intravenous  Once 12/17/14 0544 12/17/14 0734      Current Facility-Administered Medications  Medication Dose Route Frequency Provider Last Rate Last Dose  . acetaminophen (TYLENOL) tablet 650 mg  650 mg Oral Q6H PRN Juluis Mire, MD   650 mg at  12/19/14 1555  . albuterol (PROVENTIL) (2.5 MG/3ML) 0.083% nebulizer solution 2.5 mg  2.5 mg Nebulization Q2H PRN Juluis Mire, MD      . bisacodyl (DULCOLAX) suppository 10 mg  10 mg Rectal Daily PRN Juluis Mire, MD      . brimonidine (ALPHAGAN) 0.15 % ophthalmic solution 1 drop  1 drop Both Eyes BID Juluis Mire, MD   1 drop at 12/19/14 1015  . Carbidopa-Levodopa ER (SINEMET CR) 25-100 MG tablet controlled release 2 tablet  2 tablet Oral TID Juluis Mire, MD   2 tablet at 12/19/14 1555  . donepezil (ARICEPT) tablet 10 mg  10 mg Oral QHS Juluis Mire, MD   10 mg at 12/18/14 2318  . dorzolamide (TRUSOPT) 2 % ophthalmic solution 1 drop  1 drop Both Eyes BID Juluis Mire, MD   1 drop at 12/19/14 1015  . feeding supplement (PRO-STAT SUGAR FREE 64) liquid 30 mL  30 mL Oral BID BM Juluis Mire, MD   30 mL at 12/17/14 1532  . latanoprost (XALATAN) 0.005 % ophthalmic solution 1 drop  1 drop Both Eyes QHS Juluis Mire, MD   1 drop at 12/18/14 2319  . [START ON 12/20/2014] levofloxacin (LEVAQUIN) tablet 500 mg  500 mg Oral Daily Fritzi Mandes, MD      . midodrine (PROAMATINE) tablet 5 mg  5 mg Oral TID PRN Juluis Mire, MD      . ondansetron [facility]) tablet 4 mg  4 mg Oral Q6H PRN Juluis Mire, MD       Or  . ondansetron [facility]) injection 4 mg  4 mg Intravenous Q6H PRN Juluis Mire, MD      . potassium chloride SA (K-DUR,KLOR-CON) CR tablet 20 mEq  20 mEq Oral BID Juluis Mire, MD   20 mEq at 12/19/14 1015  . senna (SENOKOT) tablet 8.6 mg  1 tablet Oral BID Juluis Mire, MD   8.6 mg at 12/19/14 1015  . sodium chloride 0.9 % injection 3 mL  3 mL Intravenous Q12H Juluis Mire, MD   3 mL at 12/19/14 1555  . vitamin B-12 (CYANOCOBALAMIN) tablet 2,000 mcg  2,000 mcg Oral Daily Juluis Mire, MD   2,000 mcg at 12/19/14 1015  Meds reviewed.   Objective: Vital signs in last 24 hours: Temp:  [97.9 F (36.6 C)-98.4 F (36.9 C)] 97.9 F (36.6 C) (07/25  1224) Pulse Rate:  [78-115] 115 (  07/25 1926) Resp:  [16-25] 25 (07/25 1926) BP: (128-161)/(64-96) 137/96 mmHg (07/25 1926) SpO2:  [84 %-100 %] 100 % (07/25 1926) Weight:  [67.359 kg (148 lb 8 oz)] 67.359 kg (148 lb 8 oz) (07/25 0515)  Intake/Output from previous day: 07/24 0701 - 07/25 0700 In: 943 [P.O.:840; I.V.:3; IV Piggyback:100] Out: 650 [Urine:650] Intake/Output this shift:     Physical Exam  Constitutional: Randy Mcknight is oriented to person, place, and time. Randy Mcknight appears unhealthy. Randy Mcknight appears cachectic.  HENT:  Randy Mcknight has masked facies  Neck: Normal range of motion. Neck supple.  Cardiovascular: An irregularly irregular rhythm present. Tachycardia present.   Pulmonary/Chest: Effort normal. No respiratory distress. Randy Mcknight has decreased breath sounds in the left middle field and the left lower field.  Abdominal: Soft. Normal appearance. Randy Mcknight exhibits no mass. There is no hepatosplenomegaly. There is no tenderness. There is no CVA tenderness. A hernia is present. Hernia confirmed positive in the left inguinal area.  Lymphadenopathy:    Randy Mcknight has no cervical adenopathy.       Left: No inguinal and no supraclavicular adenopathy present.  Neurological: Randy Mcknight is alert and oriented to person, place, and time. Randy Mcknight displays weakness. Randy Mcknight exhibits abnormal muscle tone.  Skin: Skin is warm and dry.  Psychiatric: Mood and affect normal.  Vitals reviewed.   Lab Results:   Recent Labs  12/17/14 0327 12/18/14 0508 12/19/14 0502  WBC 10.7* 13.6*  --   HGB 9.4* 7.7* 7.8*  HCT 29.1* 23.9*  --   PLT 272 228  --    BMET  Recent Labs  12/18/14 0508 12/19/14 0502  NA 139 136  K 3.5 3.6  CL 103 104  CO2 29 26  GLUCOSE 111* 86  BUN 23* 30*  CREATININE 0.90 0.98  CALCIUM 8.1* 7.9*   PT/INR  Recent Labs  12/18/14 0508 12/19/14 0502  LABPROT 28.1* 18.2*  INR 2.62 1.49   ABG No results for input(s): PHART, HCO3 in the last 72 hours.  Invalid input(s): PCO2, PO2  Studies/Results: Dg Chest 1  View  12/19/2014   CLINICAL DATA:  Thoracentesis  EXAM: CHEST  1 VIEW  COMPARISON:  12/17/2014  FINDINGS: No pneumothorax. Left pleural effusion improved. Stable right hemi thorax and mediastinum. Advanced degenerative change of the right glenohumeral joint.  IMPRESSION: No pneumothorax.   Electronically Signed   By: Marybelle Killings M.D.   On: 12/19/2014 15:43   US Thoracentesis Asp Pleural Space W/img Guide  12/19/2014   CLINICAL DATA:  Large left pleural effusion.  Renal mass.  EXAM: EXAM THORACENTESIS WITH ULTRASOUND  TECHNIQUE: The procedure, risks (including but not limited to bleeding, infection, organ damage ), benefits, and alternatives were explained to the patient. Questions regarding the procedure were encouraged and answered. The patient understands and consents to the procedure. Survey ultrasound of the LEFT hemithorax was performed and an appropriate skin entry site was localized. Site was marked, prepped with Betadine, draped in usual sterile fashion, infiltrated locally with 1% lidocaine.  The Saf-T-Centesis needle was advanced into the pleural space. Dark amber fluid returned. 850 mL was removed. Post procedure imaging shows no significant residual fluid. The patient tolerated procedure well.  COMPLICATIONS: COMPLICATIONS None immediate  IMPRESSION: Technically successful ultrasound-guided left thoracentesis. Follow-up chest radiograph pending.   Electronically Signed   By: Lucrezia Europe M.D.   On: 12/19/2014 15:51   I have spoken with Dr. Posey Pronto and discussed the case with Dr. Erlene Quan.    I have reviewed Randy Mcknight CT's and  PET scans dating back to 2012.   I have reviewed Randy Mcknight recent labs.    Assessment: Left renal mass Randy Mcknight has a 10cm left renal mass with hemorrhage.  The mass appears to be most consistent with a large renal tumor, most likely a renal cell on CT report.  I originally thought it could have been a hemorrhagic cyst but after review of all of the films and reports the presents of a  neoplasm is supported by the data.  There was a solid lesion in the same area in 2013 on PET/CT that could be the current lesion it did have some hypermetabolic activity on my review and Randy Mcknight has a history of metastatic melanoma.  The bleed was probably related to Randy Mcknight elevated INR.   Randy Mcknight Hgb is stable and Randy Mcknight is not hypotensive.    At this time Randy Mcknight would not be a surgical candidate regardless of the etiology of the mass, so serial Hgb's and f/u imaging would be most appropriate.    The pleural effusion appears to have some higher density components, ?blood or tumor.   CC: Dr. Fritzi Mandes.     Carlyann Placide J 12/19/2014

## 2014-12-19 NOTE — Assessment & Plan Note (Addendum)
He has a 10cm left renal mass with hemorrhage.  The mass appears to be most consistent with a large renal tumor, most likely a renal cell on CT report.  I originally thought it could have been a hemorrhagic cyst but after review of all of the films and reports the presents of a neoplasm is supported by the data.  There was a solid lesion in the same area in 2013 on PET/CT that could be the current lesion it did have some hypermetabolic activity on my review and he has a history of metastatic melanoma.  The bleed was probably related to his elevated INR.   His Hgb is stable and he is not hypotensive.    At this time he would not be a surgical candidate regardless of the etiology of the mass, so serial Hgb's and f/u imaging would be most appropriate.

## 2014-12-19 NOTE — Progress Notes (Addendum)
[facility] [place] NAME: Randy Mcknight    MR#:  [id number]  DATE OF BIRTH:  June 06, 1930  SUBJECTIVE:  Came in with increasing sob from [facility] with chest tightness. Found to have large effusion Feels better today Wife in the room. Eating well  REVIEW OF SYSTEMS:   Review of Systems  Constitutional: Negative for fever, chills and weight loss.  HENT: Negative for ear discharge, ear pain and nosebleeds.   Eyes: Negative for blurred vision, pain and discharge.  Respiratory: Positive for shortness of breath. Negative for sputum production, wheezing and stridor.   Cardiovascular: Positive for leg swelling. Negative for chest pain, palpitations, orthopnea and PND.  Gastrointestinal: Negative for nausea, vomiting, abdominal pain and diarrhea.  Genitourinary: Negative for urgency and frequency.  Musculoskeletal: Negative for back pain and joint pain.  Neurological: Positive for weakness. Negative for sensory change, speech change and focal weakness.  Psychiatric/Behavioral: Negative for depression. The patient is not nervous/anxious.   All other systems reviewed and are negative.  Tolerating Diet:yes Tolerating PT: yet to eval  DRUG ALLERGIES:   Allergies  Allergen Reactions  . Ivp Dye [Iodinated Diagnostic Agents] Rash  . Latex Rash    VITALS:  Blood pressure 155/67, pulse 78, temperature 97.9 F (36.6 C), temperature source Oral, resp. rate 23, height 5\' 10"  (1.778 m), weight 67.359 kg (148 lb 8 oz), SpO2 98 %.  PHYSICAL EXAMINATION:   Physical Exam  GENERAL:  81 y.o.-year-old patient lying in the bed with no acute distress.  EYES: Pupils equal, round, reactive to light and accommodation. No scleral icterus. Extraocular muscles intact.  HEENT: Head atraumatic, normocephalic. Oropharynx and nasopharynx clear.dry olra mucosa  NECK:  Supple, no jugular venous distention. No thyroid enlargement, no tenderness.  LUNGS:  decreased breath sounds bilaterally left >right, no wheezing, rales, rhonchi. No use of accessory muscles of respiration.  CARDIOVASCULAR: S1, S2 normal. No murmurs, rubs, or gallops.  ABDOMEN: Soft, nontender, nondistended. Bowel sounds present. No organomegaly or mass.  EXTREMITIES: No cyanosis, clubbing or edema b/l.    NEUROLOGIC: Cranial nerves II through XII are intact. No focal Motor or sensory deficits b/l.   PSYCHIATRIC: The patient is alert and oriented x 3.  SKIN: No obvious rash, lesion, or ulcer.    LABORATORY PANEL:   CBC  Recent Labs Lab 12/18/14 0508 12/19/14 0502  WBC 13.6*  --   HGB 7.7* 7.8*  HCT 23.9*  --   PLT 228  --     Chemistries   Recent Labs Lab 12/13/14 1059  12/17/14 0926  12/19/14 0502  NA 141  < >  --   < > 136  K 2.7*  < >  --   < > 3.6  CL 102  < >  --   < > 104  CO2 32  < >  --   < > 26  GLUCOSE 94  < >  --   < > 86  BUN 15  < >  --   < > 30*  CREATININE 0.76  < >  --   < > 0.98  CALCIUM 7.9*  < >  --   < > 7.9*  MG 1.9  --   --   --   --   AST 16  --  21  --   --   ALT 6*  --  15*  --   --   ALKPHOS 58  --  62  --   --  BILITOT 0.3  --  0.6  --   --   < > = values in this interval not displayed.  Cardiac Enzymes  Recent Labs Lab 12/17/14 2107  TROPONINI 0.04*    RADIOLOGY:  Dg Chest 1 View  12/19/2014   CLINICAL DATA:  Thoracentesis  EXAM: CHEST  1 VIEW  COMPARISON:  12/17/2014  FINDINGS: No pneumothorax. Left pleural effusion improved. Stable right hemi thorax and mediastinum. Advanced degenerative change of the right glenohumeral joint.  IMPRESSION: No pneumothorax.   Electronically Signed   By: Marybelle Killings M.D.   On: 12/19/2014 15:43     ASSESSMENT AND PLAN:  81 y.o. male, and nursing home resident, with a known history of Parkinson's disease, paroxysmal atrial fibrillation on warfarin, orthostatic hypotension was brought in by the EMS with nursing home note stating acute respiratory distress with chest pain and  low-grade temperature of 99.5. According to the patient's wife who is with the patient this time she received a call from the nursing home staff stating that patient is having shortness of breath with some chest discomfort   1. Acute hypoxic respiratory failure due to large Left Pleural effuions withmild acute on chronic diastolic CHF -IV lasix prn -Thoracentesis once INR is down -INR >4.0 will give small dose of vitamin K -Elevated WBC of 10.7. Chest x-ray left pleural effusion with associated left basilar opacity reflecting consolidation versus atelectasis.  -O2 supplementation as needed, continue IV Levaquin, albuterol neb as needed.  - echocardiogram done in June 2016 EF 55-60%  2.left renal mass with possible hemorrhagic component in it given elevated INR -per wife pt has had a left real cyst found in 2009 ad had eval done which did not require further w/u -urology consult with dr Jeffie Pollock. Per his CT review appears to be hemorrhagic cyst. Supportive care D/c coumadin. Wife aware -hgb 9.4-->7.7-->7.8  3. History of past medical atrial fibrillation now with rapid regular rate. Patient received IV Cardizem 10 mg in the ED, rate improved. po Cardizem monitor heart rate closely.  -appreciate cardiology consultation  - Patient on chronic anticoagulation, INR elevated, hence Coumadin on hold. -d/c coumadin due to bleed  4. Hypokalemia. Replace potassium follow-up BMP.  5. Chronic anticoagulation, on Coumadin-on hold because of elevated INR. INR 3.9. Hold Coumadin for now -INR 4.8-->2.2  6. History of orthostatic hypotension, patient off [place] sincePatient stable. BP in reasonable range. Fall precautions  7. Parkinson's disease, stable on home medications. Continue same.  Case discussed with Care Management/Social Worker. Management plans discussed with the patient, family and they are in agreement.   CODE STATUS: DNR  DVT Prophylaxis:TEDS/SCD  TOTAL TIME TAKING CARE OF THIS  PATIENT: 40 minutes.  >50% time spent on counselling and coordination of care with family (wife) and Dr Jeffie Pollock  POSSIBLE D/C  DEPENDING ON CLINICAL CONDITION.   Declyn Delsol M.D on 12/19/2014 at 3:48 PM  Between 7am to 6pm - Pager - [phone]  After 6pm go to [email or web address] - password [email or web address] [facility]  Office  [phone]  CC: Primary care physician; Lelon Huh, MD

## 2014-12-20 ENCOUNTER — Telehealth: Payer: Self-pay | Admitting: Family Medicine

## 2014-12-20 DIAGNOSIS — N2889 Other specified disorders of kidney and ureter: Secondary | ICD-10-CM

## 2014-12-20 LAB — BASIC METABOLIC PANEL
ANION GAP: 6 (ref 5–15)
BUN: 23 mg/dL — AB (ref 6–20)
CALCIUM: 8.1 mg/dL — AB (ref 8.9–10.3)
CO2: 27 mmol/L (ref 22–32)
CREATININE: 0.78 mg/dL (ref 0.61–1.24)
Chloride: 108 mmol/L (ref 101–111)
GFR calc non Af Amer: >60 mL/min (ref >60)
GLUCOSE: 96 mg/dL (ref 65–99)
POTASSIUM: 3.7 mmol/L (ref 3.5–5.1)
SODIUM: 141 mmol/L (ref 135–145)

## 2014-12-20 LAB — PROTIME-INR
INR: 1.48
Prothrombin Time: 18.1 seconds — ABNORMAL HIGH (ref 11.4–15.0)

## 2014-12-20 MED ORDER — MIDODRINE HCL 5 MG PO TABS: 5.0000 mg | ORAL_TABLET | Freq: Three times a day (TID) | ORAL | Status: AC | PRN

## 2014-12-20 MED ORDER — FLUDROCORTISONE ACETATE 0.1 MG PO TABS: 0.1000 mg | ORAL_TABLET | ORAL | Status: AC | PRN

## 2014-12-20 NOTE — Evaluation (Signed)
Physical Therapy Evaluation Patient Details Name: Randy Mcknight MRN: [id number] DOB: 08-07-1930 Today's Date: 12/20/2014   History of Present Illness  presented to ER from [facility] secondary to SOB, chest pain and tachycardia; admitted for a-fib with RVR (requiring IV cardizem) and L pleural effusion.  Hospital course significant for thoracentesis (7/25) witih removal of 839mL fluid and for noted renal mass (with hemorrhagic component).  Concern for malignancy in renal mass and pleural effusion.  Of note, patient with previous hospitalizaiton in 10/2014 due to syncopal episodes; has been at [facility] since discharge.  Clinical Impression  Upon evaluation, patient alert and oriented to basic information.  Bilat UE/LE strength and ROM grossly 3- to 4-/5 throughout; generally weak and deconditioned.  Demonstrates very poor standing balance, requiring mod assist +2 for all sit/stand and short-distance gait (8') with RW.  Additional distance deferred secondary to fatigue and positive (asymptomatic) orthostasis.  RN informed/aware. Would benefit from skilled PT to address above deficits and promote optimal return to PLOF; recommend transition to STR upon discharge from acute hospitalization. May benefit from palliative care consult-dicussed with social work.    Follow Up Recommendations SNF    Equipment Recommendations       Recommendations for Other Services Rehab consult     Precautions / Restrictions Precautions Precautions: Fall Restrictions Weight Bearing Restrictions: No      Mobility  Bed Mobility               General bed mobility comments: seated in chair beginning/end of session  Transfers Overall transfer level: Needs assistance Equipment used: Rolling walker (2 wheeled) Transfers: Sit to/from Stand Sit to Stand: Mod assist;+2 physical assistance         General transfer comment: cuing for hand placement, anterior weight shift, lift off and dynamic  balance  Ambulation/Gait Ambulation/Gait assistance: Mod assist;+2 physical assistance Ambulation Distance (Feet): 8 Feet Assistive device: Rolling walker (2 wheeled)       General Gait Details: step to gait pattern with excessive bilat ankle PF, posterior weight shift throughout gait cycle; poor balance/righting reactions.  Excessive supination R ankle/forefoot.  Additional distance limited by (asymptomatic) orthostasis.  Stairs            Wheelchair Mobility    Modified Rankin (Stroke Patients Only)       Balance Overall balance assessment: Needs assistance Sitting-balance support: No upper extremity supported;Feet supported Sitting balance-Leahy Scale: Fair     Standing balance support: Bilateral upper extremity supported Standing balance-Leahy Scale: Poor                               Pertinent Vitals/Pain Pain Assessment: No/denies pain    Home Living Family/patient expects to be discharged to:: Skilled nursing facility                 Additional Comments: has been at [facility] since discharge from previous hospitalization (10/2014); limited participation/progress due to additional medical conditions (per wife report)    Prior Function Level of Independence: Needs assistance         Comments: At baseline, lives with wife in [place] home with 3 steps to enter; ambulatory for limited household distances with [phone] and assist from wife     Hand Dominance        Extremity/Trunk Assessment   Upper Extremity Assessment: Generalized weakness (grossly 3-/5 throughout; shoulder elevation to shoulder height only)  Lower Extremity Assessment: Generalized weakness (bilat LEs at least 3+ to 4-/5; pitting edema mid-calf distally)         Communication   Communication: No difficulties  Cognition Arousal/Alertness: Awake/alert Behavior During Therapy: WFL for tasks assessed/performed Overall Cognitive Status: Within Functional  Limits for tasks assessed                      General Comments      Exercises Other Exercises Other Exercises: Additional sit/stands with RW, mod assist +1-2 for safety; limited carry-over of hand placement and technique between trials.  Worked to develop awareness and self-correction of posterior LOB; unable to complete without constant assist from therapist. (8 minutes)      Assessment/Plan    PT Assessment Patient needs continued PT services  PT Diagnosis Difficulty walking;Generalized weakness   PT Problem List Decreased strength;Decreased range of motion;Decreased activity tolerance;Decreased balance;Decreased mobility;Decreased coordination;Decreased knowledge of use of DME;Decreased safety awareness;Decreased knowledge of precautions;Cardiopulmonary status limiting activity  PT Treatment Interventions DME instruction;Gait training;Stair training;Functional mobility training;Therapeutic activities;Therapeutic exercise;Balance training;Patient/family education   PT Goals (Current goals can be found in the Care Plan section) Acute Rehab PT Goals Patient Stated Goal: Per wife, "to go to rehab and get stronger so we can go home" PT Goal Formulation: With patient/family Time For Goal Achievement: 01/03/15 Potential to Achieve Goals: Good    Frequency Min 2X/week   Barriers to discharge Decreased caregiver support      Co-evaluation               End of Session Equipment Utilized During Treatment: Gait belt Activity Tolerance: Patient tolerated treatment well;Treatment limited secondary to medical complications (Comment) (orthostasis) Patient left: in chair;with call bell/phone within reach;with chair alarm set;with family/visitor present Nurse Communication: Mobility status (orthostasis)         Time: 1478-2956 PT Time Calculation (min) (ACUTE ONLY): 27 min   Charges:   PT Evaluation $Initial PT Evaluation Tier I: 1 Procedure PT  Treatments $Therapeutic Activity: 8-22 mins   PT G Codes:        Randy Mcknight, PT, DPT, NCS 12/20/2014, 1:43 PM [phone]

## 2014-12-20 NOTE — Telephone Encounter (Signed)
FYI

## 2014-12-20 NOTE — Discharge Summary (Signed)
Randy Mcknight at [place] NAME: Randy Mcknight    MR#:  [id number]  DATE OF BIRTH:  February 24, 1931  DATE OF ADMISSION:  12/17/2014 ADMITTING PHYSICIAN: Juluis Mire, MD  DATE OF DISCHARGE: 12/20/14  PRIMARY CARE PHYSICIAN: Lelon Huh, MD    ADMISSION DIAGNOSIS:  Pleural effusion [J90] Atrial fibrillation with rapid ventricular response [I48.91]  DISCHARGE DIAGNOSIS:  Acute hypoxic respiratory fiaure due to left sided pleural effusion s/p thoracentesis July 25th  Left renal mass (probably malignant) wth hemorrhage Acute anemia due to hemorrhage into renal mass Chronic afib now OFF coumadin gen weakness  SECONDARY DIAGNOSIS:   Past Medical History  Diagnosis Date  . Arthritis   . Hernia   . PAF (paroxysmal atrial fibrillation)     a. on warfarin  . Parkinson disease   . Glaucoma 2005  . Cancer     Basal cell melanoma  . Malignant melanoma of skin of upper limb, including shoulder December 29, 2007    wide excision Clark level IV,, 3 mm deep malignant melanoma,, negative margins, negative sentinel nodes.pT3a,N0  . Syncope and collapse   . Orthostatic hypotension   . Chronic diastolic HF (heart failure)     HOSPITAL COURSE:   81 y.o. male, and nursing home resident, with a known history of Parkinson's disease, paroxysmal atrial fibrillation on warfarin, orthostatic hypotension was brought in by the EMS with nursing home note stating acute respiratory distress with chest pain and low-grade temperature of 99.5. According to the patient's wife who is with the patient this time she received a call from the nursing home staff stating that patient is having shortness of breath with some chest discomfort   1. Acute hypoxic respiratory failure due to large Left Pleural effuions with mild acute on chronic diastolic CHF -IV lasix prn. Prn torsemide -INR >4.0 will give small dose of vitamin K -Elevated WBC of 10.7.  -O2  supplementation as needed, albuterol neb as needed.  - echocardiogram done in June 2016 EF 55-60% -s/p thoracentesis with removal of 850 cc fluid. Cytology pending  2.left renal mass (malignant) with  hemorrhagic component in it given elevated INR -per wife pt has had a left real cyst found in 2009 ad had eval done which did not require further w/u -urology consult with dr Jeffie Pollock. Per his CT review appears to be hemorrhagic cyst with renal mass likley malignant Supportive care, not a candidate for further w/u D/c coumadin. Wife aware -hgb 9.4-->7.7-->7.8  3. History of past medical atrial fibrillation now with rapid regular rate. Patient received IV Cardizem 10 mg in the ED, rate improved. po Cardizem monitor heart rate closely.  -appreciate cardiology consultation  -d/c coumadin due to bleed  4. Hypokalemia. Replaced  5. Chronic anticoagulation, on Coumadin-on hold because of elevated INR. INR 3.9. Hold Coumadin for now -INR 4.8-->2.2--1.4  6. History of orthostatic hypotension, patient off [place] sincePatient stable. BP in reasonable range. Fall precautions  7. Parkinson's disease, stable on home medications. Continue same.  Case discussed with Care Management/Social Worker. Management plans discussed with the patient, family and they are in agreement.  CODE STATUS: DNR  DVT Prophylaxis:TEDS/SCD PT to see today Will d/c to rehab today   DISCHARGE CONDITIONS:  Guarded  CONSULTS OBTAINED:  Treatment Team:  Evans Lance, MD Minna Merritts, MD Irine Seal, MD  DRUG ALLERGIES:   Allergies  Allergen Reactions  . Ivp Dye [Iodinated Diagnostic Agents] Rash  . Latex Rash  DISCHARGE MEDICATIONS:   Current Discharge Medication List    CONTINUE these medications which have CHANGED   Details  fludrocortisone (FLORINEF) 0.1 MG tablet Take 1 tablet (0.1 mg total) by mouth as needed. Give only if pt experiences orthostatic hypotension Qty: 30 tablet, Refills: 0     midodrine (PROAMATINE) 5 MG tablet Take 1 tablet (5 mg total) by mouth 3 (three) times daily as needed (for SBP <110). Qty: 30 tablet, Refills: 0      CONTINUE these medications which have NOT CHANGED   Details  acetaminophen (TYLENOL) 325 MG tablet Take 650 mg by mouth every 4 (four) hours as needed.    Amino Acids-Protein Hydrolys (FEEDING SUPPLEMENT, PRO-STAT SUGAR FREE 64,) LIQD Take 30 mLs by mouth 2 (two) times daily between meals.    bisacodyl (DULCOLAX) 10 MG suppository Place 10 mg rectally daily as needed for moderate constipation.    brimonidine (ALPHAGAN P) 0.1 % SOLN Place 1 drop into both eyes 2 (two) times daily.    carbidopa-levodopa (SINEMET IR) 25-100 MG per tablet Take 2 tablets by mouth 3 (three) times daily.     donepezil (ARICEPT) 10 MG tablet Take 10 mg by mouth at bedtime.    dorzolamide (TRUSOPT) 2 % ophthalmic solution 1 drop 2 (two) times daily.    ENSURE (ENSURE) Take 237 mLs by mouth 2 (two) times daily between meals.    latanoprost (XALATAN) 0.005 % ophthalmic solution Place 1 drop into both eyes at bedtime.    magnesium hydroxide (MILK OF MAGNESIA) 800 MG/5ML suspension Take 30 mLs by mouth daily as needed for constipation.    potassium chloride SA (K-DUR,KLOR-CON) 20 MEQ tablet Take 20 mEq by mouth 2 (two) times daily.    senna (SENOKOT) 8.6 MG TABS tablet Take 1 tablet by mouth 2 (two) times daily.    sorbitol 70 % solution Take 30 mLs by mouth every 6 (six) hours as needed.    torsemide (DEMADEX) 5 MG tablet Take 5 mg by mouth daily.    vitamin B-12 (CYANOCOBALAMIN) 1000 MCG tablet Take 2,000 mcg by mouth daily.      STOP taking these medications     COUMADIN 3 MG tablet         If you experience worsening of your admission symptoms, develop shortness of breath, life threatening emergency, suicidal or homicidal thoughts you must seek medical attention immediately by calling 911 or calling your MD immediately  if symptoms less  severe.  You Must read complete instructions/literature along with all the possible adverse reactions/side effects for all the Medicines you take and that have been prescribed to you. Take any new Medicines after you have completely understood and accept all the possible adverse reactions/side effects.   Please note  You were cared for by a hospitalist during your hospital stay. If you have any questions about your discharge medications or the care you received while you were in the hospital after you are discharged, you can call the unit and asked to speak with the hospitalist on call if the hospitalist that took care of you is not available. Once you are discharged, your primary care physician will handle any further medical issues. Please note that NO REFILLS for any discharge medications will be authorized once you are discharged, as it is imperative that you return to your primary care physician (or establish a relationship with a primary care physician if you do not have one) for your aftercare needs so that they can reassess your  need for medications and monitor your lab values. Today   SUBJECTIVE   Sob improved. sats 96% on Ra  VITAL SIGNS:  Blood pressure 137/65, pulse 79, temperature 98.4 F (36.9 C), temperature source Oral, resp. rate 22, height 5\' 10"  (1.778 m), weight 67.359 kg (148 lb 8 oz), SpO2 96 %.  I/O:   Intake/Output Summary (Last 24 hours) at 12/20/14 0858 Last data filed at 12/20/14 0610  Gross per 24 hour  Intake    120 ml  Output    975 ml  Net   -855 ml    PHYSICAL EXAMINATION:   GENERAL: 81 y.o.-year-old patient lying in the bed with no acute distress.  EYES: Pupils equal, round, reactive to light and accommodation. No scleral icterus. Extraocular muscles intact.  HEENT: Head atraumatic, normocephalic. Oropharynx and nasopharynx clear.dry olra mucosa  NECK: Supple, no jugular venous distention. No thyroid enlargement, no tenderness.  LUNGS: decreased  breath sounds bilaterally left >right, no wheezing, rales, rhonchi. No use of accessory muscles of respiration.  CARDIOVASCULAR: S1, S2 normal. No murmurs, rubs, or gallops.  ABDOMEN: Soft, nontender, nondistended. Bowel sounds present. No organomegaly or mass.  EXTREMITIES: No cyanosis, clubbing or edema b/l.  NEUROLOGIC: Cranial nerves II through XII are intact. No focal Motor or sensory deficits b/l. weak PSYCHIATRIC: The patient is alert and oriented x 3.  SKIN: No obvious rash, lesion, or ulcer.  DATA REVIEW:   CBC   Recent Labs Lab 12/18/14 0508 12/19/14 0502  WBC 13.6*  --   HGB 7.7* 7.8*  HCT 23.9*  --   PLT 228  --     Chemistries   Recent Labs Lab 12/13/14 1059  12/17/14 0926  12/20/14 0418  NA 141  < >  --   < > 141  K 2.7*  < >  --   < > 3.7  CL 102  < >  --   < > 108  CO2 32  < >  --   < > 27  GLUCOSE 94  < >  --   < > 96  BUN 15  < >  --   < > 23*  CREATININE 0.76  < >  --   < > 0.78  CALCIUM 7.9*  < >  --   < > 8.1*  MG 1.9  --   --   --   --   AST 16  --  21  --   --   ALT 6*  --  15*  --   --   ALKPHOS 58  --  62  --   --   BILITOT 0.3  --  0.6  --   --   < > = values in this interval not displayed.  Microbiology Results   Recent Results (from the past 240 hour(s))  Urine culture     Status: None   Collection Time: 12/14/14  3:24 PM  Result Value Ref Range Status   Specimen Description URINE, CLEAN CATCH  Final   Special Requests NONE  Final   Culture   Final    MULTIPLE SPECIES PRESENT, SUGGEST RECOLLECTION IF CLINICALLY INDICATED   Report Status 12/16/2014 FINAL  Final  Culture, blood (routine x 2)     Status: None (Preliminary result)   Collection Time: 12/17/14  5:51 AM  Result Value Ref Range Status   Specimen Description BLOOD LEFT ARM  Final   Special Requests BOTTLES DRAWN AEROBIC AND ANAEROBIC 5 CC  Final   Culture NO GROWTH 3 DAYS  Final   Report Status PENDING  Incomplete  Culture, blood (routine x 2)     Status: None  (Preliminary result)   Collection Time: 12/17/14  5:56 AM  Result Value Ref Range Status   Specimen Description BLOOD LEFT FOREARM  Final   Special Requests BOTTLES DRAWN AEROBIC AND ANAEROBIC  5 CC  Final   Culture NO GROWTH 3 DAYS  Final   Report Status PENDING  Incomplete    RADIOLOGY:  Dg Chest 1 View  12/19/2014   CLINICAL DATA:  Thoracentesis  EXAM: CHEST  1 VIEW  COMPARISON:  12/17/2014  FINDINGS: No pneumothorax. Left pleural effusion improved. Stable right hemi thorax and mediastinum. Advanced degenerative change of the right glenohumeral joint.  IMPRESSION: No pneumothorax.   Electronically Signed   By: Marybelle Killings M.D.   On: 12/19/2014 15:43   US Thoracentesis Asp Pleural Space W/img Guide  12/19/2014   CLINICAL DATA:  Large left pleural effusion.  Renal mass.  EXAM: EXAM THORACENTESIS WITH ULTRASOUND  TECHNIQUE: The procedure, risks (including but not limited to bleeding, infection, organ damage ), benefits, and alternatives were explained to the patient. Questions regarding the procedure were encouraged and answered. The patient understands and consents to the procedure. Survey ultrasound of the LEFT hemithorax was performed and an appropriate skin entry site was localized. Site was marked, prepped with Betadine, draped in usual sterile fashion, infiltrated locally with 1% lidocaine.  The Saf-T-Centesis needle was advanced into the pleural space. Dark amber fluid returned. 850 mL was removed. Post procedure imaging shows no significant residual fluid. The patient tolerated procedure well.  COMPLICATIONS: COMPLICATIONS None immediate  IMPRESSION: Technically successful ultrasound-guided left thoracentesis. Follow-up chest radiograph pending.   Electronically Signed   By: Lucrezia Europe M.D.   On: 12/19/2014 15:51     Management plans discussed with the patient, family and they are in agreement.  CODE STATUS:     Code Status Orders        Start     Ordered   12/18/14 1322  Do not  attempt resuscitation (DNR)   Continuous    Question Answer Comment  In the event of cardiac or respiratory ARREST Do not call a "code blue"   In the event of cardiac or respiratory ARREST Do not perform Intubation, CPR, defibrillation or ACLS   In the event of cardiac or respiratory ARREST Use medication by any route, position, wound care, and other measures to relive pain and suffering. May use oxygen, suction and manual treatment of airway obstruction as needed for comfort.      12/18/14 1322    Advance Directive Documentation        Most Recent Value   Type of Advance Directive  Living will   Pre-existing out of facility DNR order (yellow form or pink MOST form)     "MOST" Form in Place?        TOTAL TIME TAKING CARE OF THIS PATIENT: 40 minutes.    Latria Mccarron M.D on 12/20/2014 at 8:58 AM  Between 7am to 6pm - Pager - [phone] After 6pm go to [email or web address] - password [email or web address] [facility]  Office  [phone]  CC: Primary care physician; Lelon Huh, MD

## 2014-12-20 NOTE — Telephone Encounter (Signed)
Pt is being discharged from [facility] today for acute respiratory distress.  I have scheduled hospital follow up for 12/26/2014/MW

## 2014-12-20 NOTE — Discharge Instructions (Signed)
Cont PT

## 2014-12-20 NOTE — Progress Notes (Signed)
Checked to see if pre-authorization would be needed for non-emergent EMS transport.  Per [employer]’s automated system, [phone], patient has a [employer] Medicare Advantage PPO policy.  [employer] Medicare PPO plans do not require pre-auth for non-emergent ground transports using service codes A0426 or A0428.   °

## 2014-12-20 NOTE — Progress Notes (Addendum)
Report called to [facility]/ iv removed/ transported off via wheelchair/ transported to snf by family

## 2014-12-20 NOTE — Progress Notes (Signed)
Urology Consult Follow Up  Subjective: Continues to have mild left flank pain. Breathing better since thoracentesis. Having a bowel movement this morning.  Anti-infectives: Anti-infectives    Start     Dose/Rate Route Frequency Ordered Stop   12/20/14 1000  levofloxacin (LEVAQUIN) tablet 500 mg  Status:  Discontinued     500 mg Oral Daily 12/19/14 0941 12/20/14 0752   12/18/14 0600  levofloxacin (LEVAQUIN) IVPB 500 mg  Status:  Discontinued     500 mg 100 mL/hr over 60 Minutes Intravenous Every 24 hours 12/17/14 0932 12/19/14 0941   12/17/14 0545  levofloxacin (LEVAQUIN) IVPB 750 mg     750 mg 100 mL/hr over 90 Minutes Intravenous  Once 12/17/14 0544 12/17/14 0734      Current Facility-Administered Medications  Medication Dose Route Frequency Provider Last Rate Last Dose  . acetaminophen (TYLENOL) tablet 650 mg  650 mg Oral Q6H PRN Juluis Mire, MD   650 mg at 12/19/14 1555  . albuterol (PROVENTIL) (2.5 MG/3ML) 0.083% nebulizer solution 2.5 mg  2.5 mg Nebulization Q2H PRN Juluis Mire, MD      . bisacodyl (DULCOLAX) suppository 10 mg  10 mg Rectal Daily PRN Juluis Mire, MD      . brimonidine (ALPHAGAN) 0.15 % ophthalmic solution 1 drop  1 drop Both Eyes BID Juluis Mire, MD   1 drop at 12/19/14 2115  . Carbidopa-Levodopa ER (SINEMET CR) 25-100 MG tablet controlled release 2 tablet  2 tablet Oral TID Juluis Mire, MD   2 tablet at 12/19/14 2115  . donepezil (ARICEPT) tablet 10 mg  10 mg Oral QHS Juluis Mire, MD   10 mg at 12/19/14 2115  . dorzolamide (TRUSOPT) 2 % ophthalmic solution 1 drop  1 drop Both Eyes BID Juluis Mire, MD   1 drop at 12/19/14 2115  . feeding supplement (PRO-STAT SUGAR FREE 64) liquid 30 mL  30 mL Oral BID BM Juluis Mire, MD   30 mL at 12/17/14 1532  . latanoprost (XALATAN) 0.005 % ophthalmic solution 1 drop  1 drop Both Eyes QHS Juluis Mire, MD   1 drop at 12/19/14 2115  . midodrine (PROAMATINE) tablet 5 mg  5 mg Oral TID  PRN Juluis Mire, MD      . ondansetron [facility]) tablet 4 mg  4 mg Oral Q6H PRN Juluis Mire, MD       Or  . ondansetron [facility]) injection 4 mg  4 mg Intravenous Q6H PRN Juluis Mire, MD      . potassium chloride SA (K-DUR,KLOR-CON) CR tablet 20 mEq  20 mEq Oral BID Juluis Mire, MD   20 mEq at 12/19/14 2115  . senna (SENOKOT) tablet 8.6 mg  1 tablet Oral BID Juluis Mire, MD   8.6 mg at 12/19/14 2115  . sodium chloride 0.9 % injection 3 mL  3 mL Intravenous PRN Fritzi Mandes, MD      . vitamin B-12 (CYANOCOBALAMIN) tablet 2,000 mcg  2,000 mcg Oral Daily Juluis Mire, MD   2,000 mcg at 12/19/14 1015     Objective: Vital signs in last 24 hours: Temp:  [97.9 F (36.6 C)-98.4 F (36.9 C)] 98.4 F (36.9 C) (07/26 0402) Pulse Rate:  [78-115] 79 (07/26 0402) Resp:  [16-25] 22 (07/26 0402) BP: (128-161)/(64-96) 137/65 mmHg (07/26 0402) SpO2:  [84 %-100 %] 96 % (07/26 0402)  Intake/Output from previous day: 07/25 0701 - 07/26 0700  In: 120 [P.O.:120] Out: 1075 [Urine:1075] Intake/Output this shift:     Physical Exam  Constitutional:  Unwell appearing, elderly, frail  Neck: Neck supple.  Pulmonary/Chest:  Wearing oxygen, nasal cannula  Abdominal: Soft. Bowel sounds are normal.  Neurological: He is alert.  Skin: Skin is warm and dry.    Lab Results:   Recent Labs  12/18/14 0508 12/19/14 0502  WBC 13.6*  --   HGB 7.7* 7.8*  HCT 23.9*  --   PLT 228  --    BMET  Recent Labs  12/19/14 0502 12/20/14 0418  NA 136 141  K 3.6 3.7  CL 104 108  CO2 26 27  GLUCOSE 86 96  BUN 30* 23*  CREATININE 0.98 0.78  CALCIUM 7.9* 8.1*   PT/INR  Recent Labs  12/19/14 0502 12/20/14 0418  LABPROT 18.2* 18.1*  INR 1.49 1.48   ABG No results for input(s): PHART, HCO3 in the last 72 hours.  Invalid input(s): PCO2, PO2  Studies/Results: Dg Chest 1 View  12/19/2014   CLINICAL DATA:  Thoracentesis  EXAM: CHEST  1 VIEW  COMPARISON:  12/17/2014  FINDINGS:  No pneumothorax. Left pleural effusion improved. Stable right hemi thorax and mediastinum. Advanced degenerative change of the right glenohumeral joint.  IMPRESSION: No pneumothorax.   Electronically Signed   By: Marybelle Killings M.D.   On: 12/19/2014 15:43   US Thoracentesis Asp Pleural Space W/img Guide  12/19/2014   CLINICAL DATA:  Large left pleural effusion.  Renal mass.  EXAM: EXAM THORACENTESIS WITH ULTRASOUND  TECHNIQUE: The procedure, risks (including but not limited to bleeding, infection, organ damage ), benefits, and alternatives were explained to the patient. Questions regarding the procedure were encouraged and answered. The patient understands and consents to the procedure. Survey ultrasound of the LEFT hemithorax was performed and an appropriate skin entry site was localized. Site was marked, prepped with Betadine, draped in usual sterile fashion, infiltrated locally with 1% lidocaine.  The Saf-T-Centesis needle was advanced into the pleural space. Dark amber fluid returned. 850 mL was removed. Post procedure imaging shows no significant residual fluid. The patient tolerated procedure well.  COMPLICATIONS: COMPLICATIONS None immediate  IMPRESSION: Technically successful ultrasound-guided left thoracentesis. Follow-up chest radiograph pending.   Electronically Signed   By: Lucrezia Europe M.D.   On: 12/19/2014 15:51     Assessment: 81 year old male with multiple medical problems known to have enlarging 10 cm left renal mass which is consistent with probable RCC. Not a surgical candidate. Patient was seen in follow-up this morning and all questions were answered, daughter at bedside. Thoracentesis cytology pending.  Plan: -follow-up hemoglobin this a.m. -No acute intervention or evidence of bleeding -Follow-up thoracentesis cytology -Recommend outpatient follow-up with urology if patient discharged in the near future     LOS: 3 days    Randy Mcknight 12/20/2014

## 2014-12-20 NOTE — Clinical Social Work Note (Signed)
CSW notified facility, pt, and pt's wife that pt would DC back to [facility] today, possibly via EMS.  Family is also willing to transport if EMS if not needed.  PT will work with pt before DC.  MD verbally notified CSW she would like resumption of PT for pt.  CSW will follow to DC

## 2014-12-22 LAB — CULTURE, BLOOD (ROUTINE X 2)
CULTURE: NO GROWTH
Culture: NO GROWTH

## 2014-12-22 LAB — CYTOLOGY - NON PAP

## 2014-12-23 ENCOUNTER — Telehealth: Payer: Self-pay | Admitting: Family Medicine

## 2014-12-23 LAB — BODY FLUID CULTURE: Culture: NO GROWTH

## 2014-12-23 NOTE — Telephone Encounter (Signed)
Pt's wife called to cancel the appt for 12/26/14 because pt is now in [facility]. Mrs. [name] wanted to thank everyone for all the care and support they have received from our office. Thanks TNP

## 2014-12-26 ENCOUNTER — Encounter: Payer: Medicare (Managed Care) | Admitting: Cardiovascular Disease

## 2014-12-26 ENCOUNTER — Encounter
Admission: RE | Admit: 2014-12-26 | Discharge: 2014-12-26 | Disposition: A | Discharge status: Home / Self Care | Payer: Medicare Other | Source: Ambulatory Visit | Attending: Internal Medicine | Admitting: Internal Medicine

## 2014-12-26 ENCOUNTER — Inpatient Hospital Stay: Payer: Medicare Other | Admitting: Family Medicine

## 2014-12-27 ENCOUNTER — Ambulatory Visit: Payer: Medicare Other | Admitting: Urology

## 2015-01-25 ENCOUNTER — Telehealth: Payer: Self-pay | Admitting: Cardiovascular Disease

## 2015-01-25 NOTE — Telephone Encounter (Signed)
Wife called to clarify appt had been cancelled .  Patient deceased .

## 2015-01-26 DEATH — deceased

## 2015-02-02 ENCOUNTER — Ambulatory Visit: Payer: Medicare (Managed Care) | Admitting: Cardiovascular Disease

## 2015-02-21 LAB — PH, BODY FLUID: PH, BODY FLUID: 7.9

## 2016-06-29 IMAGING — MR MRI HEAD WITHOUT AND WITH CONTRAST
11 of 13 series · 40 of 48 positions shown · IV contrast (12ml Multihance)
Comparison: No intracranial enhancing lesion or bony destructive
lesion to suggest the presence of intracranial metastatic disease.

CLINICAL DATA: 82-year-old with Parkinson's disease. Melanoma
staging.

EXAM:
MRI HEAD WITHOUT AND WITH CONTRAST
TECHNIQUE: Multiplanar, multiecho pulse sequences of the brain and surrounding
structures were obtained without and with intravenous contrast.
CONTRAST:  12 cc MultiHance.

[Series 4: DWI · axial · 5.0mm · 1.80mm/px · z∈[-50,+112]mm · 3 of 27 slices shown (1 of 4)]
[im 1/27]
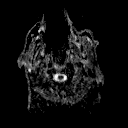
[im 14/27]
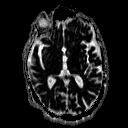
[im 27/27]
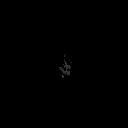

[Series 6: DWI · coronal · 5.0mm · 1.80mm/px · 4 of 40 slices shown (2 of 4)]
[im 1/40]
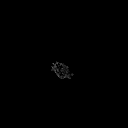
[im 14/40]
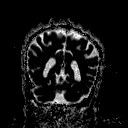
[im 27/40]
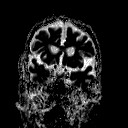
[im 40/40]
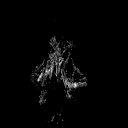

[Series 7: T2 · axial · 5.0mm · 0.60mm/px · z∈[-50,+112]mm · 3 of 27 slices shown (1 of 2)]
[im 1/27]
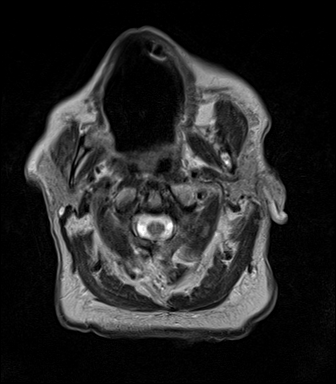
[im 14/27]
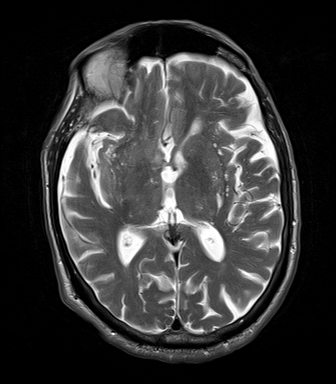
[im 27/27]
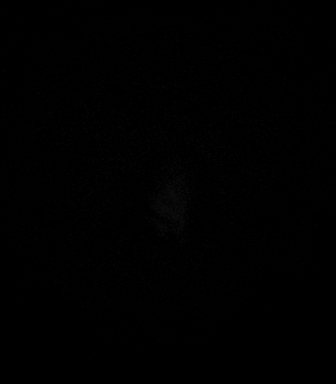

[Series 8: FLAIR · axial · 5.0mm · 0.45mm/px · z∈[-50,+112]mm · 3 of 27 slices shown]
[im 1/27]
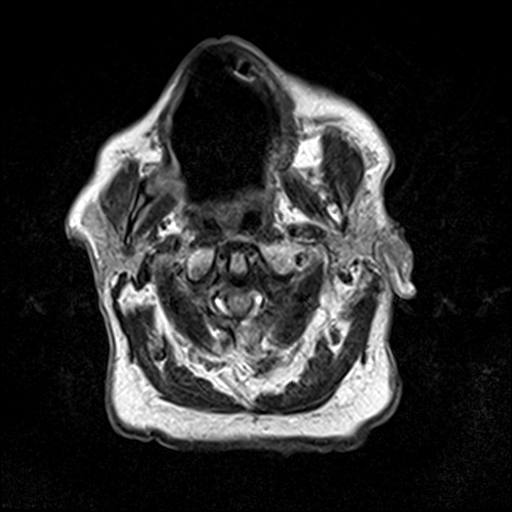
[im 14/27]
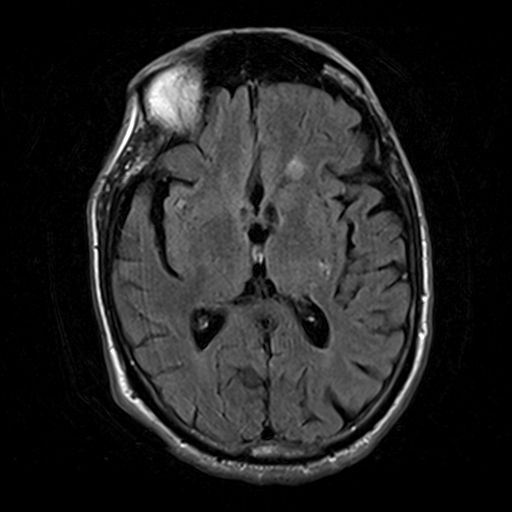
[im 27/27]
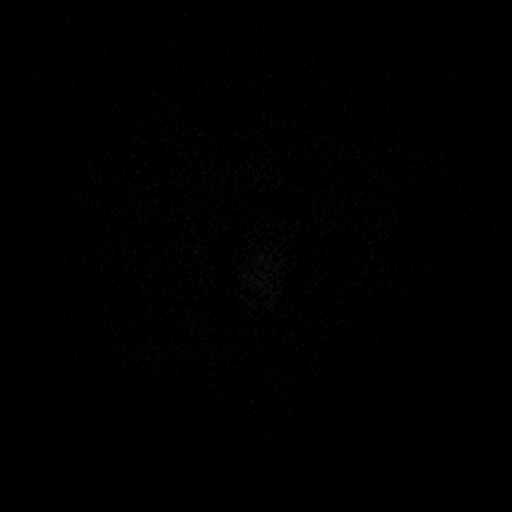

[Series 9: T2 · axial · 5.0mm · 0.45mm/px · z∈[-50,+112]mm · 3 of 27 slices shown (2 of 2)]
[im 1/27]
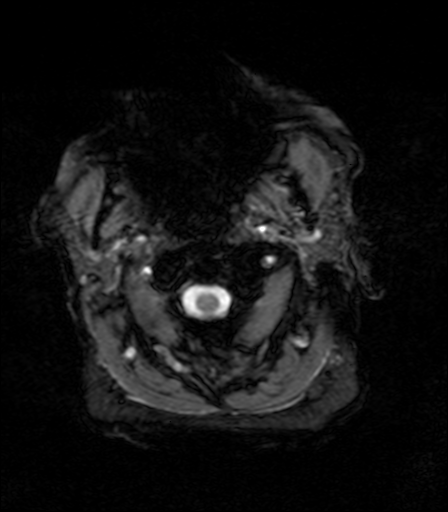
[im 14/27]
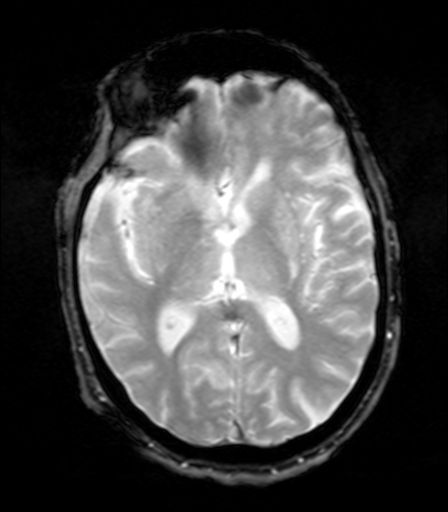
[im 27/27]
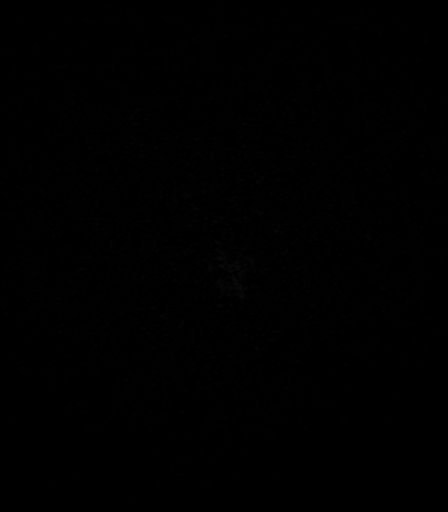

[Series 11: T2 post-contrast · coronal · 5.0mm · 0.49mm/px · 4 of 40 slices shown]
[im 1/40]
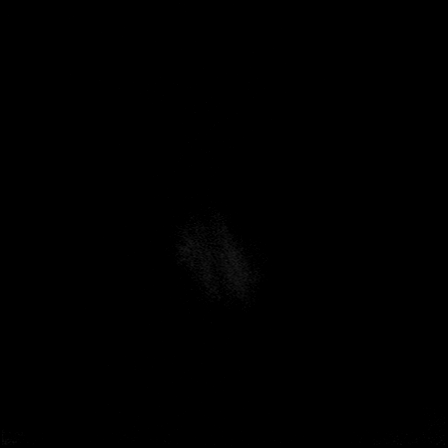
[im 14/40]
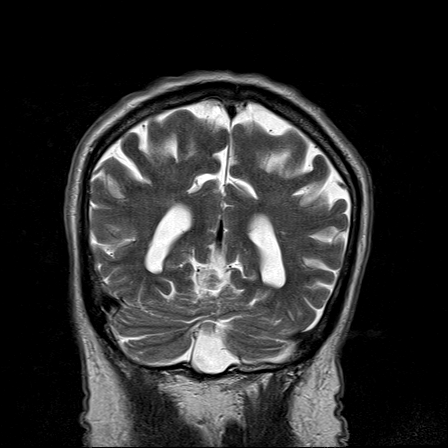
[im 27/40]
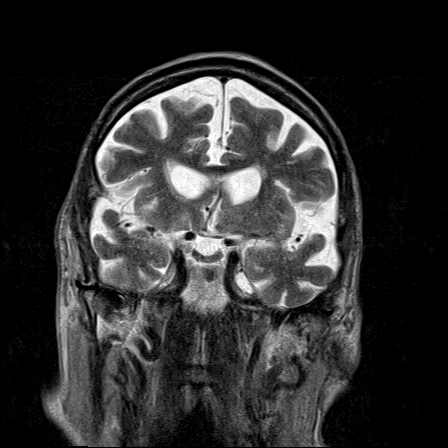
[im 40/40]
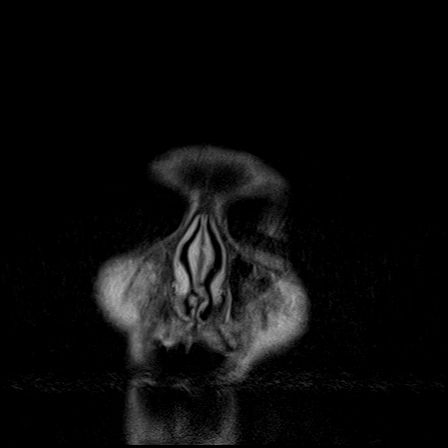

[Series 12: T1 post-contrast · axial · 3.0mm · 1.00mm/px · z∈[-55,+125]mm · 6 of 64 slices shown (1 of 3)]
[im 1/64]
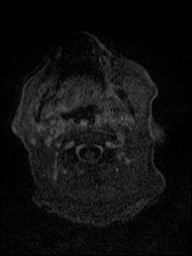
[im 13/64]
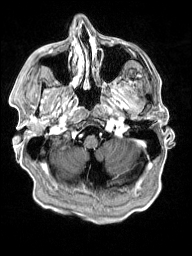
[im 26/64]
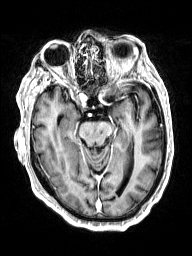
[im 38/64]
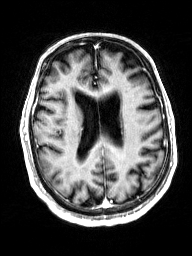
[im 51/64]
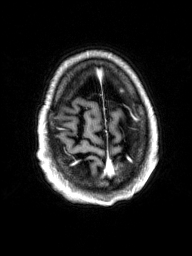
[im 64/64]
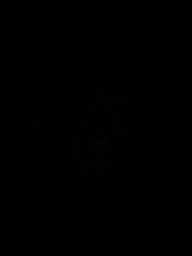

[Series 13: T1 post-contrast · coronal · 5.0mm · 0.43mm/px · 4 of 40 slices shown (2 of 3)]
[im 1/40]
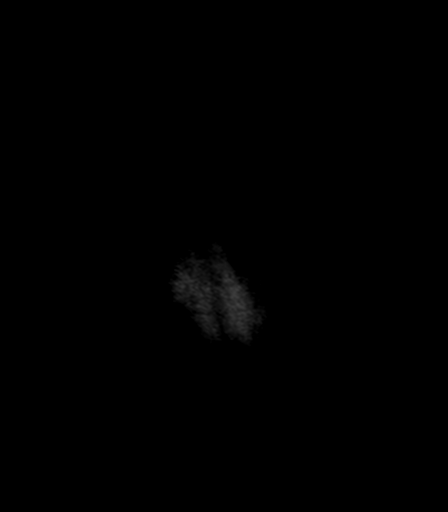
[im 14/40]
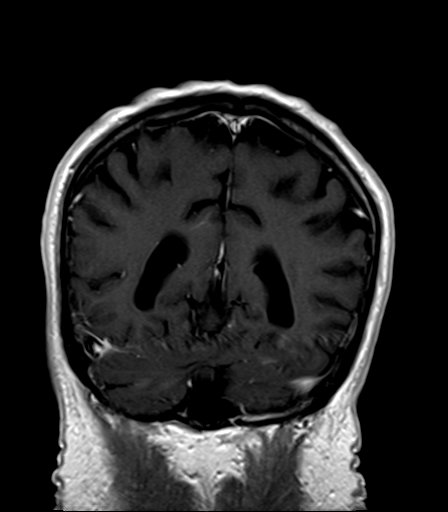
[im 27/40]
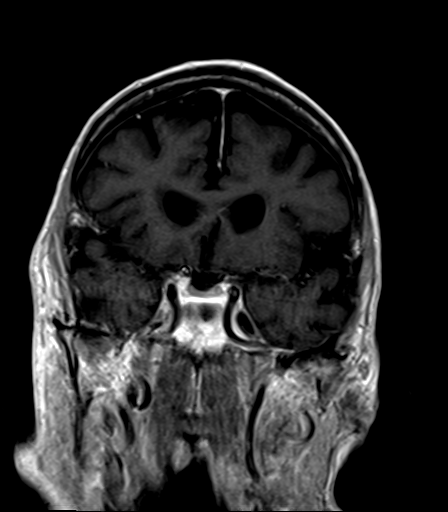
[im 40/40]
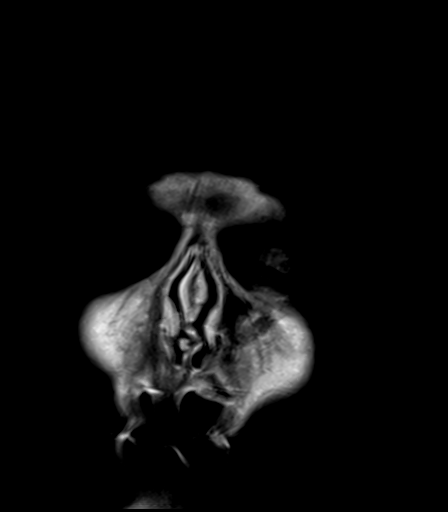

[Series 14: T1 post-contrast · sagittal · 5.0mm · 0.45mm/px · 3 of 28 slices shown (3 of 3)]
[im 1/28]
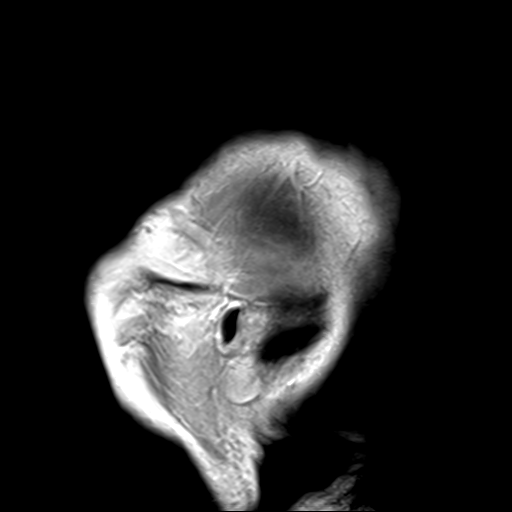
[im 14/28]
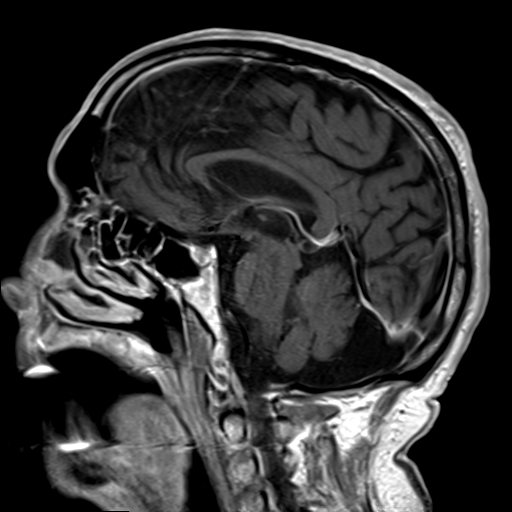
[im 28/28]
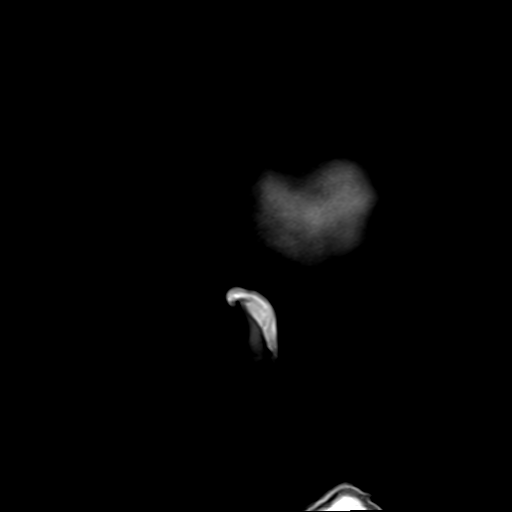

[Series 100: DWI · axial · 5.0mm · 1.80mm/px · z∈[-50,+112]mm · 3 of 26 slices shown (3 of 4)]
[im 1/26]
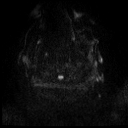
[im 13/26]
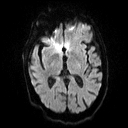
[im 26/26]
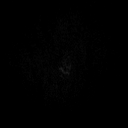

[Series 101: DWI · coronal · 5.0mm · 1.80mm/px · 4 of 39 slices shown (4 of 4)]
[im 1/39]
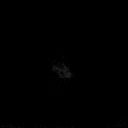
[im 13/39]
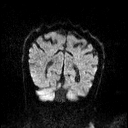
[im 26/39]
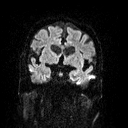
[im 39/39]
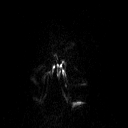

[40 of 48 positions shown; findings below may reference images not displayed]

No acute infarct.

No intracranial hemorrhage.

Mild small vessel disease type changes.

Global atrophy without hydrocephalus.

Major intracranial vascular structures are patent. Mild
atherosclerotic type changes right vertebral artery.

Degenerative changes upper cervical spine.

Cervical medullary junction, pituitary region, pineal region and
orbital structures unremarkable.

Mega cisterna magna incidentally noted.
FINDINGS: No evidence of intracranial metastatic disease.

Mild small vessel disease type changes.

Global atrophy without hydrocephalus.

## 2017-03-31 IMAGING — CR DG CHEST 2V
2 series · 2 of 2 positions shown · non-contrast
Comparison: 10/02/2011 PA and lateral chest radiograph.

CLINICAL DATA: Weakness. Fatigue. Hypoxia. Atrial fibrillation.
Parkinson's disease. Hospital admission yesterday.

EXAM:
CHEST  2 VIEW

[chest lat]
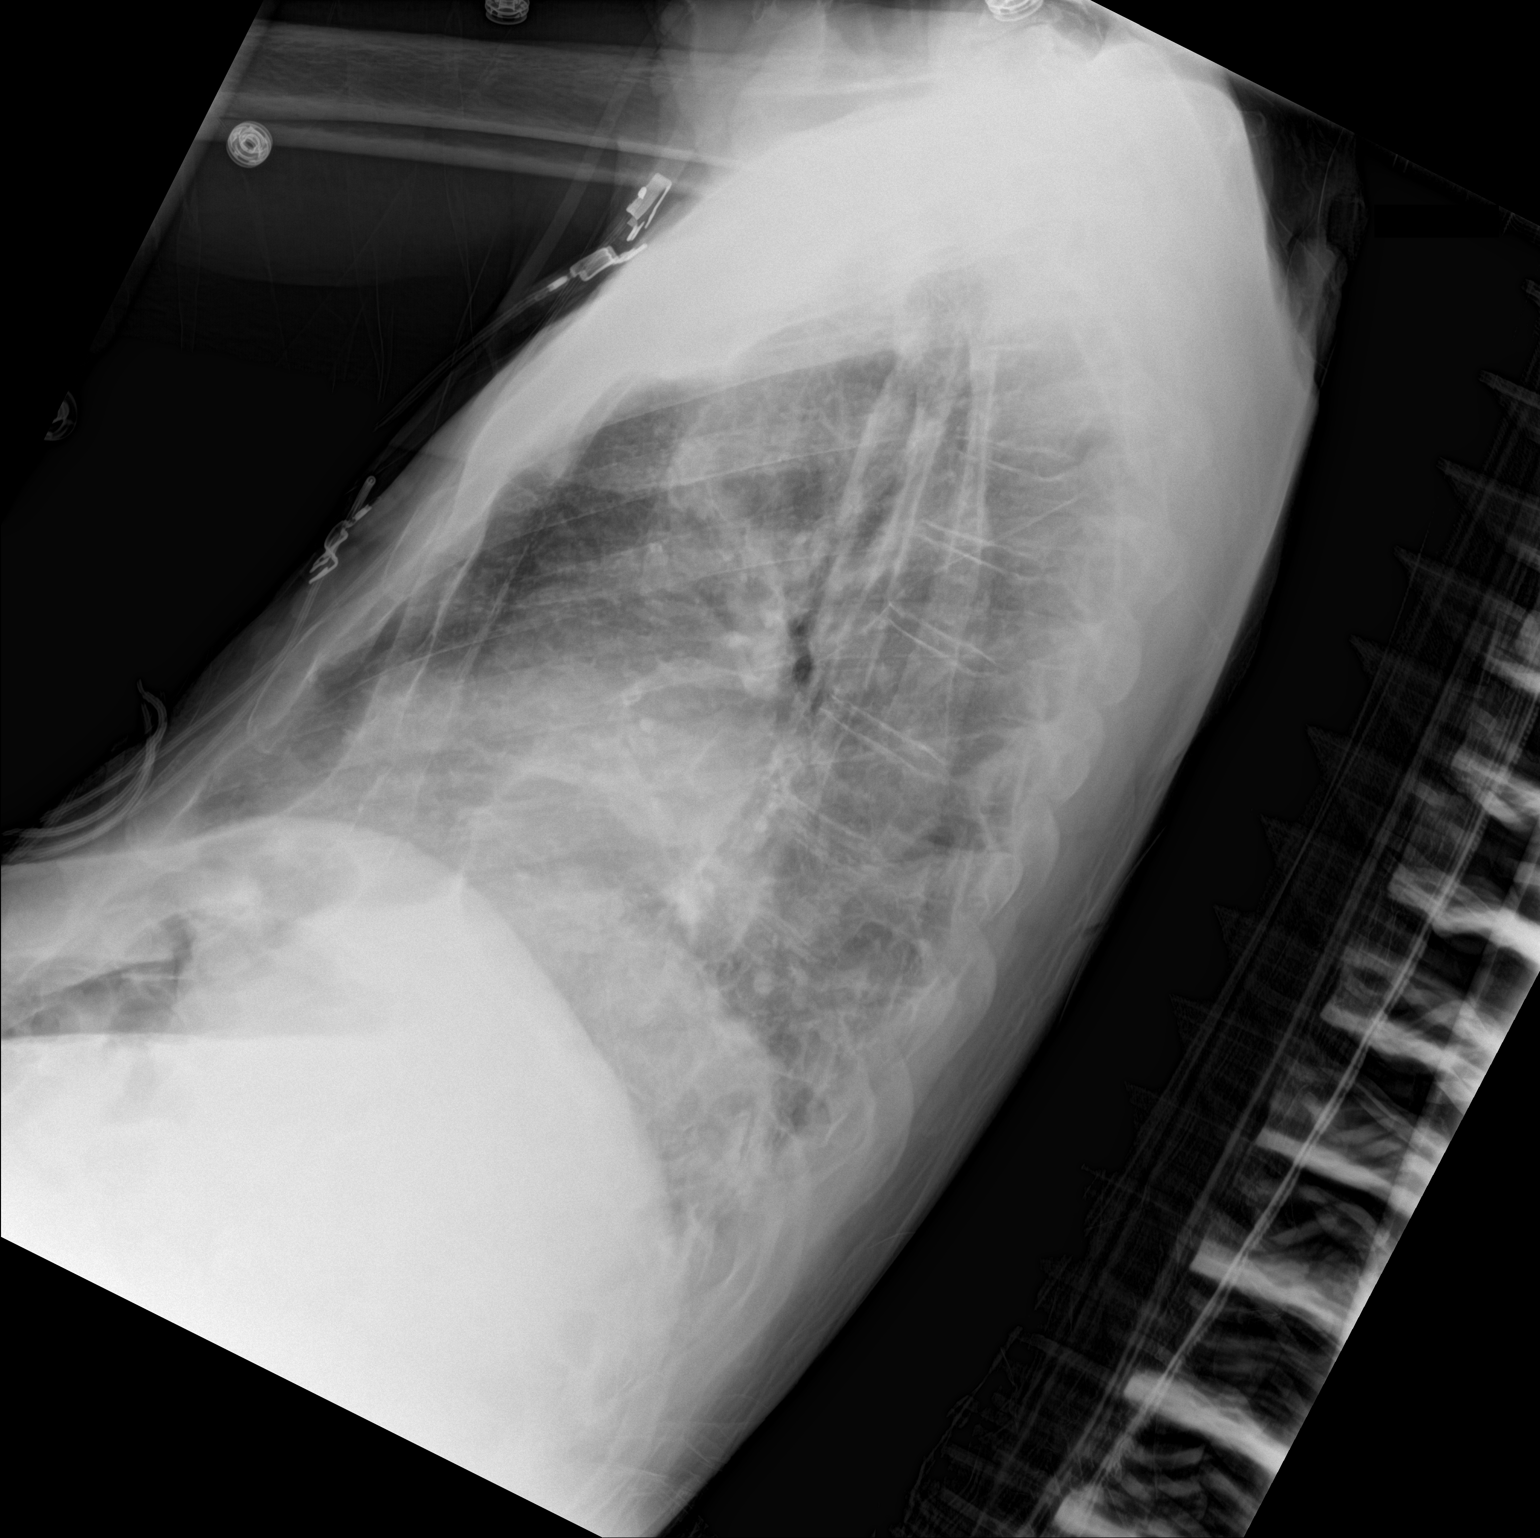

[chest ap]
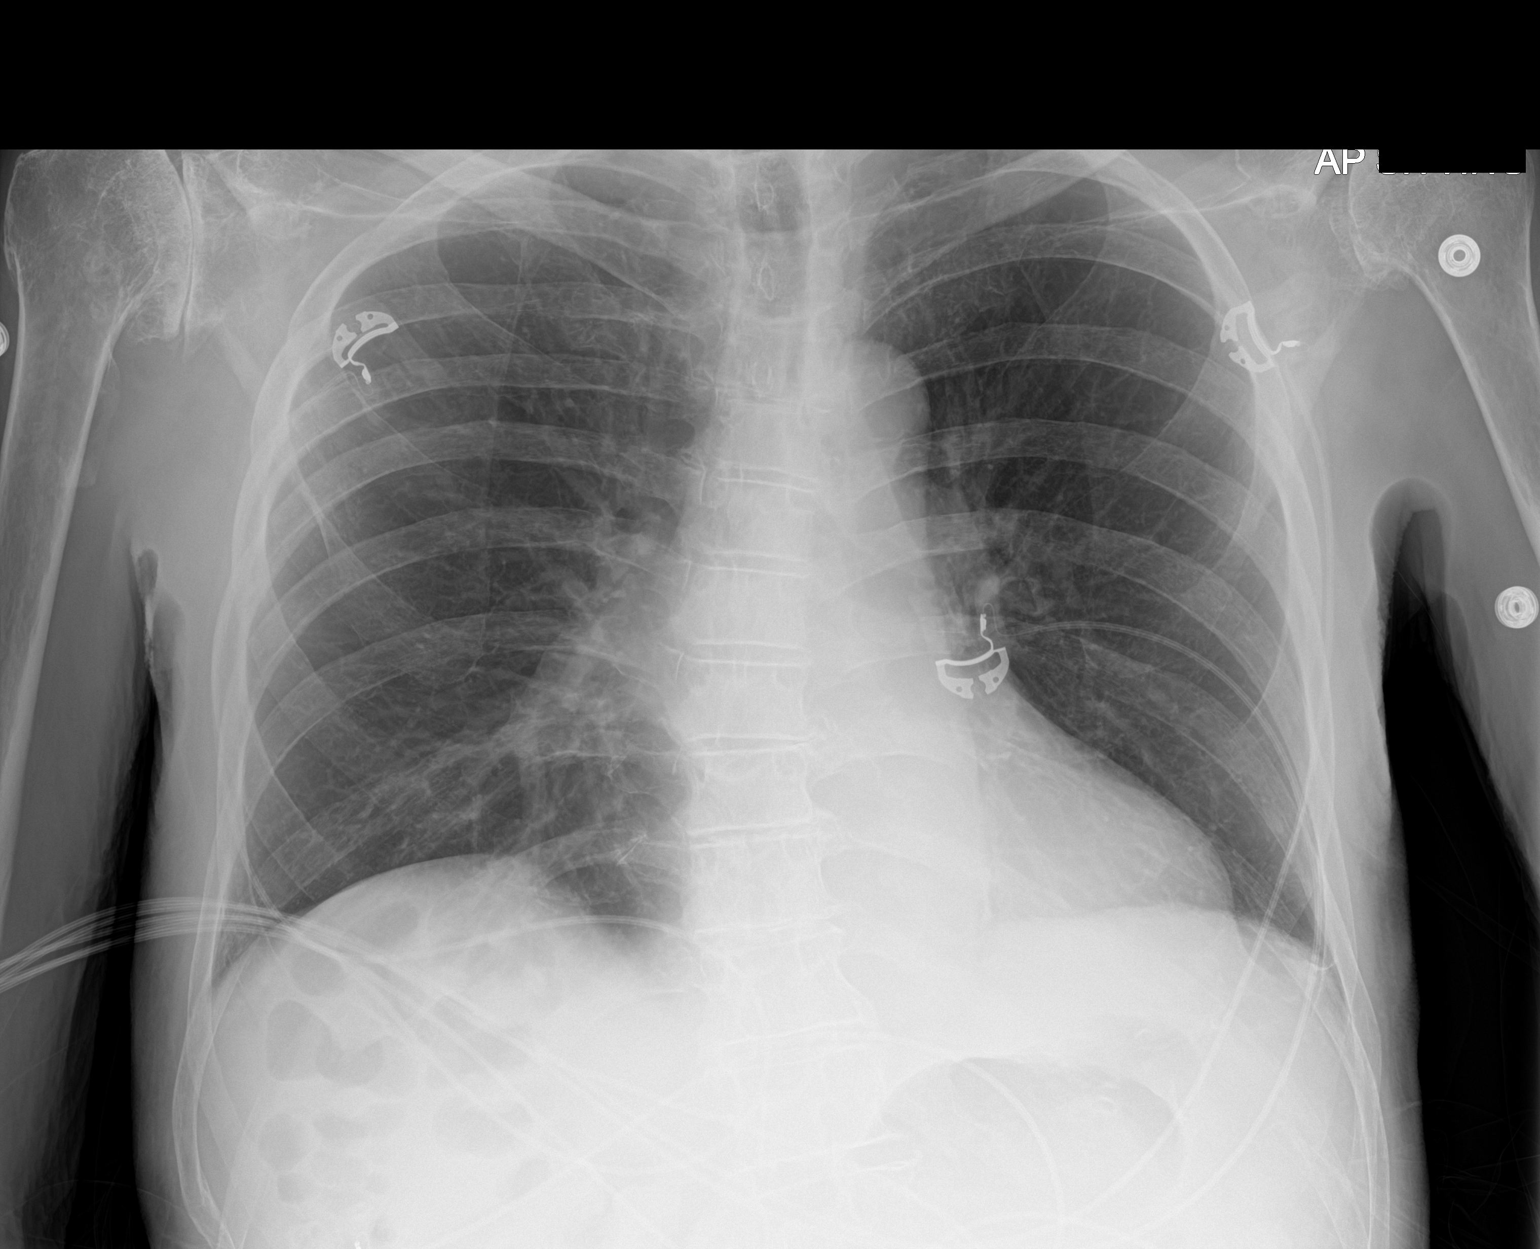

[2 of 2 positions shown; findings below may reference images not displayed]

FINDINGS: Pectus excavatum deformity is present which produces the abnormal
appearance of the RIGHT hilar vasculature. This is a chronic
finding. There is no airspace disease or effusion. Monitoring leads
project over the chest. Cardiopericardial silhouette is within
normal limits.

Severe bilateral glenohumeral osteoarthritis. Flattening of the
RIGHT humeral head compatible with old AVN.
IMPRESSION: No active cardiopulmonary disease or interval change. Pectus
excavatum.
